# Patient Record
Sex: Female | Born: 1975 | Race: White | Hispanic: No | Marital: Married | Smoking: Never smoker
Health system: Southern US, Community
[De-identification: ages and names within clinical notes are randomized; demographics above are authoritative.]

## PROBLEM LIST (undated history)

## (undated) DIAGNOSIS — N920 Excessive and frequent menstruation with regular cycle: Secondary | ICD-10-CM

## (undated) DIAGNOSIS — F419 Anxiety disorder, unspecified: Secondary | ICD-10-CM

## (undated) DIAGNOSIS — N946 Dysmenorrhea, unspecified: Secondary | ICD-10-CM

## (undated) DIAGNOSIS — F909 Attention-deficit hyperactivity disorder, unspecified type: Secondary | ICD-10-CM

## (undated) DIAGNOSIS — F32A Depression, unspecified: Secondary | ICD-10-CM

## (undated) DIAGNOSIS — F329 Major depressive disorder, single episode, unspecified: Secondary | ICD-10-CM

## (undated) HISTORY — DX: Anxiety disorder, unspecified: F41.9

## (undated) HISTORY — PX: FINGER SURGERY: SHX640

## (undated) SURGERY — Surgical Case
Anesthesia: *Unknown

---

## 2001-09-13 ENCOUNTER — Other Ambulatory Visit: Admission: RE | Admit: 2001-09-13 | Discharge: 2001-09-13 | Payer: Self-pay | Admitting: *Deleted

## 2002-12-01 ENCOUNTER — Other Ambulatory Visit: Admission: RE | Admit: 2002-12-01 | Discharge: 2002-12-01 | Payer: Self-pay | Admitting: Internal Medicine

## 2003-11-27 ENCOUNTER — Other Ambulatory Visit: Admission: RE | Admit: 2003-11-27 | Discharge: 2003-11-27 | Payer: Self-pay | Admitting: Internal Medicine

## 2005-01-21 ENCOUNTER — Other Ambulatory Visit: Admission: RE | Admit: 2005-01-21 | Discharge: 2005-01-21 | Payer: Self-pay | Admitting: Internal Medicine

## 2006-01-22 ENCOUNTER — Other Ambulatory Visit: Admission: RE | Admit: 2006-01-22 | Discharge: 2006-01-22 | Payer: Self-pay | Admitting: Cardiology

## 2007-03-26 ENCOUNTER — Other Ambulatory Visit: Admission: RE | Admit: 2007-03-26 | Discharge: 2007-03-26 | Payer: Self-pay | Admitting: Internal Medicine

## 2009-12-15 HISTORY — PX: FINGER SURGERY: SHX640

## 2012-03-10 ENCOUNTER — Ambulatory Visit: Payer: 59

## 2012-03-10 ENCOUNTER — Encounter: Payer: Self-pay | Admitting: Family Medicine

## 2012-03-10 ENCOUNTER — Ambulatory Visit (INDEPENDENT_AMBULATORY_CARE_PROVIDER_SITE_OTHER): Payer: 59 | Admitting: Family Medicine

## 2012-03-10 VITALS — BP 103/71 | HR 74 | Temp 98.0°F | Resp 16 | Ht 66.0 in | Wt 157.8 lb

## 2012-03-10 DIAGNOSIS — Z9289 Personal history of other medical treatment: Secondary | ICD-10-CM

## 2012-03-10 DIAGNOSIS — Z0289 Encounter for other administrative examinations: Secondary | ICD-10-CM

## 2012-03-10 DIAGNOSIS — Z0000 Encounter for general adult medical examination without abnormal findings: Secondary | ICD-10-CM | POA: Insufficient documentation

## 2012-03-10 DIAGNOSIS — Z021 Encounter for pre-employment examination: Secondary | ICD-10-CM

## 2012-03-10 DIAGNOSIS — Z3040 Encounter for surveillance of contraceptives, unspecified: Secondary | ICD-10-CM | POA: Insufficient documentation

## 2012-03-10 DIAGNOSIS — Z228 Carrier of other infectious diseases: Secondary | ICD-10-CM

## 2012-03-10 NOTE — Progress Notes (Signed)
  Subjective:    Patient ID: Alexa Leon, female    DOB: August 26, 1976, 36 y.o.   MRN: [id number]  HPI This healthy 36 y.o. 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 She has no chronic health issues and takes OCPs without adverse effects.  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 She has no symptoms.   Review of Systems  Constitutional: Negative.   HENT: Negative.   Cardiovascular: Negative.   Gastrointestinal: Negative.   Genitourinary: Negative.   Musculoskeletal: Negative.   Neurological: Negative.   Psychiatric/Behavioral: Negative.        Objective:   Physical Exam  Vitals reviewed. Constitutional: She is oriented to person, place, and time. She appears well-developed and well-nourished. No distress.  HENT:  Head: Normocephalic and atraumatic.  Left Ear: External ear normal.  Nose: Nose normal.  Mouth/Throat: Oropharynx is clear and moist.  Eyes: EOM are normal. Pupils are equal, round, and reactive to light. No scleral icterus.  Neck: Normal range of motion. Neck supple. No thyromegaly present.  Cardiovascular: Normal rate, regular rhythm and normal heart sounds.  Exam reveals no gallop and no friction rub.   No murmur heard. Pulmonary/Chest: Effort normal and breath sounds normal. No respiratory distress.  Abdominal: Soft. Bowel sounds are normal. She exhibits no mass. There is no tenderness. There is no guarding.  Musculoskeletal: Normal range of motion. She exhibits no edema and no tenderness.  Lymphadenopathy:    She has no cervical adenopathy.  Neurological: She is alert and oriented to person, place, and time. She has normal reflexes. No cranial nerve deficit. Coordination normal.  Skin: Skin is  warm and dry.  Psychiatric: She has a normal mood and affect. Her behavior is normal. Judgment and thought content normal.    [facility] reading (PRIMARY) by  Dr. Audria Nine: Negative for active disease        Assessment & Plan:   1. Physical exam, pre-employment  No abnormalities identified  2. History of positive PPD  DG Chest 2 View : Negative   Form completed  (see scanned document)

## 2012-10-04 ENCOUNTER — Other Ambulatory Visit (HOSPITAL_COMMUNITY)
Admission: RE | Admit: 2012-10-04 | Discharge: 2012-10-04 | Disposition: A | Discharge status: Home / Self Care | Payer: BC Managed Care – PPO | Source: Ambulatory Visit | Attending: Family Medicine | Admitting: Family Medicine

## 2012-10-04 DIAGNOSIS — Z124 Encounter for screening for malignant neoplasm of cervix: Secondary | ICD-10-CM | POA: Insufficient documentation

## 2012-10-04 DIAGNOSIS — Z1151 Encounter for screening for human papillomavirus (HPV): Secondary | ICD-10-CM | POA: Insufficient documentation

## 2013-03-28 IMAGING — CR DG CHEST 2V
2 series · 2 of 2 positions shown · non-contrast
Comparison: None.

CLINICAL DATA: History of positive PPD test for tuberculosis 13
years previously.

CHEST - 2 VIEW

[PA]
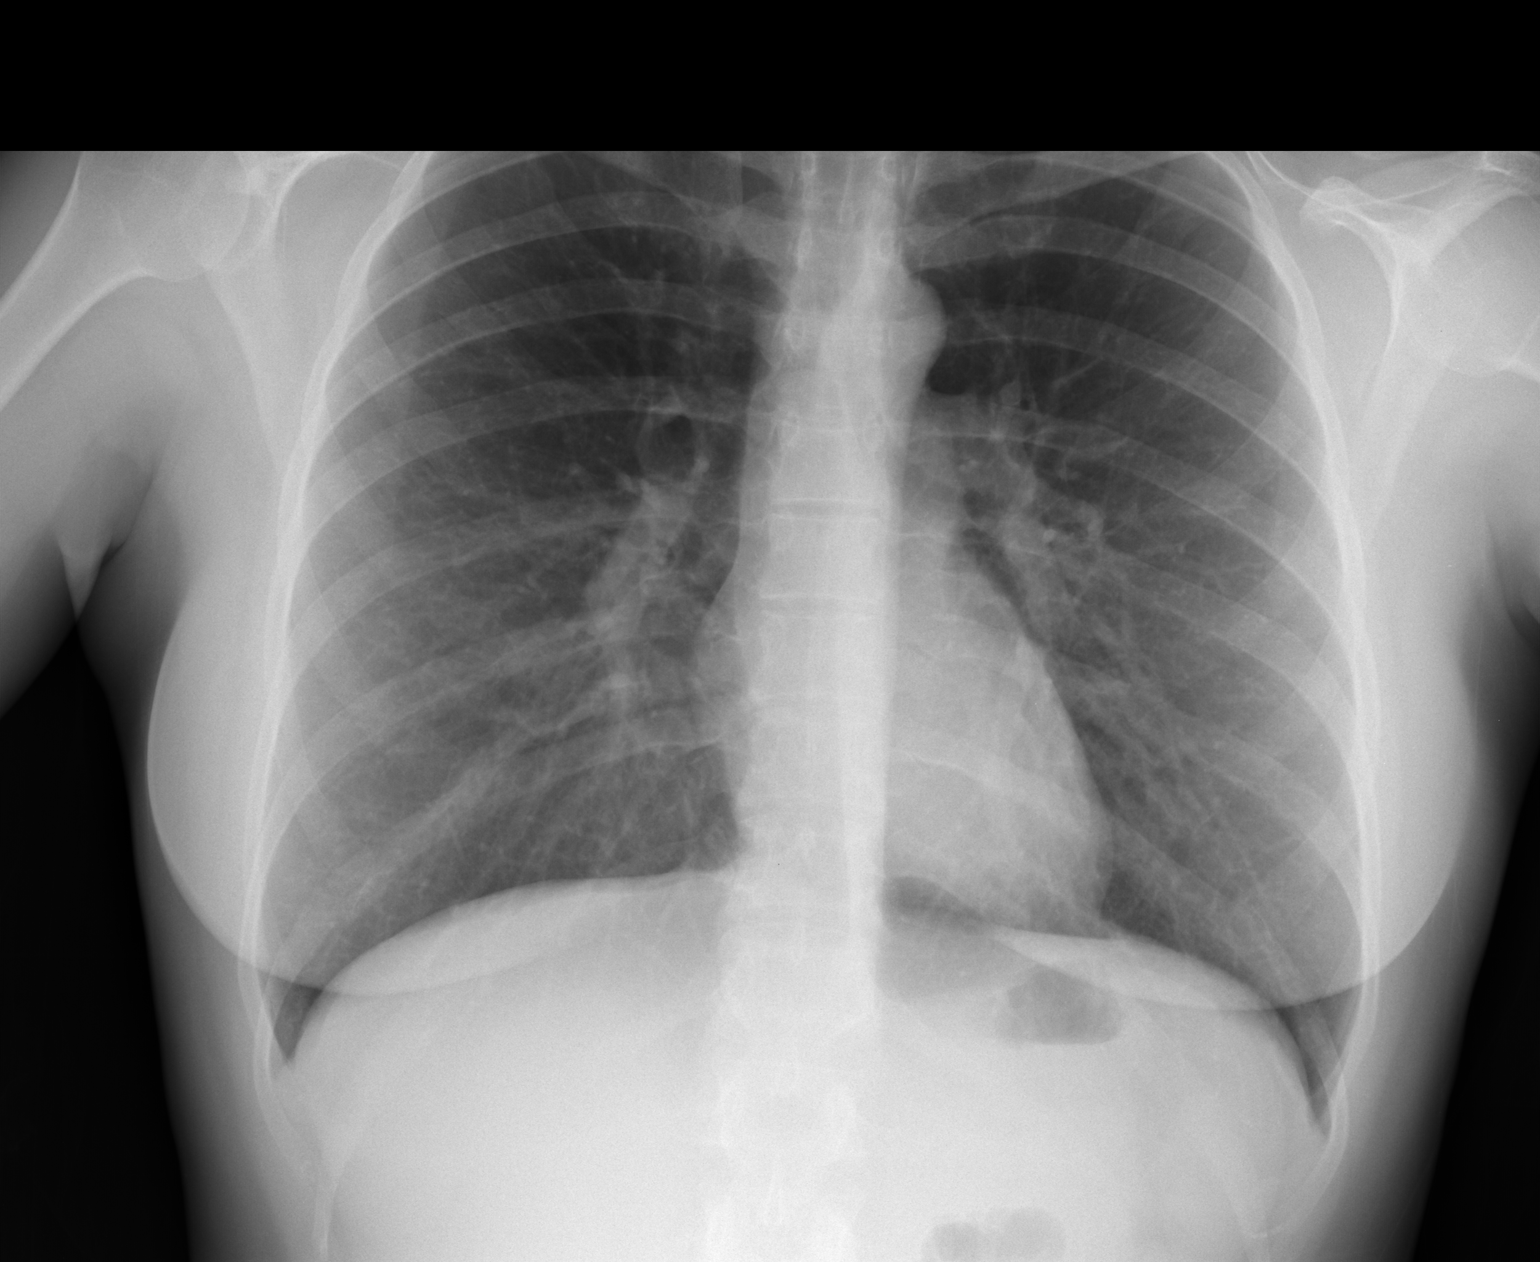

[lateral]
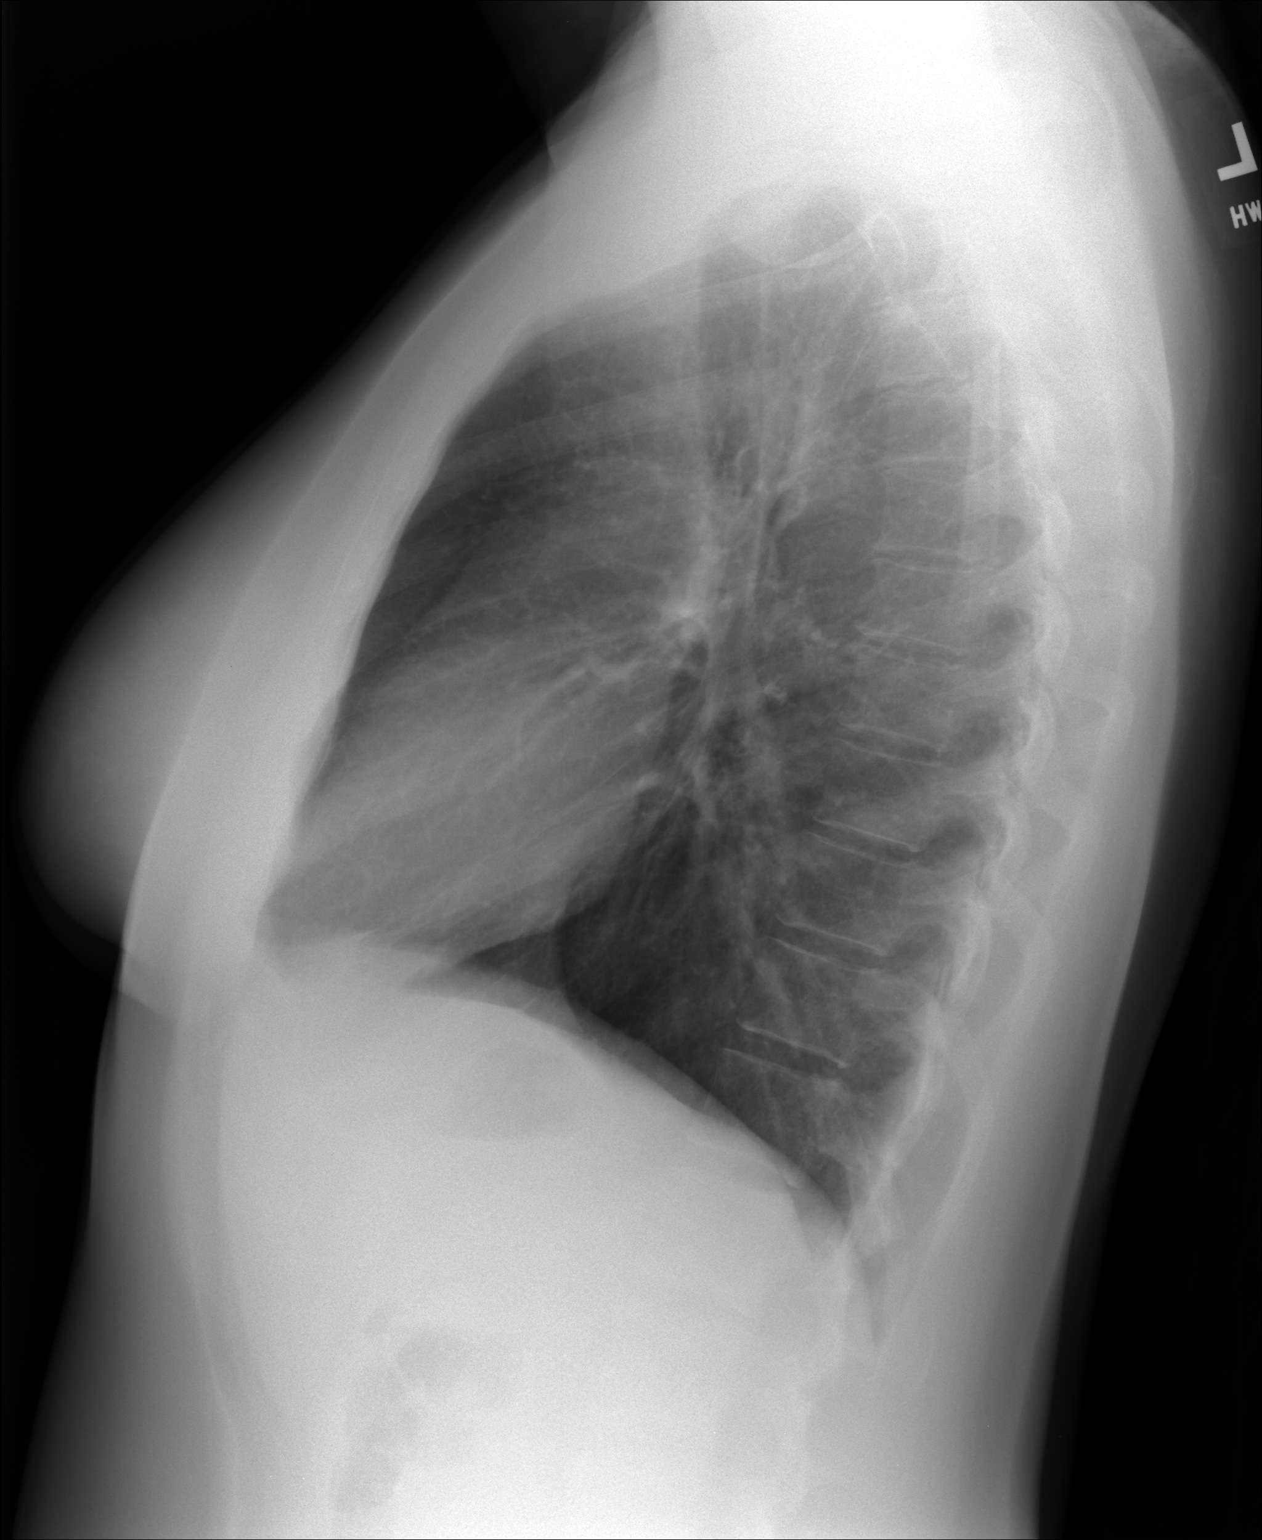

[2 of 2 positions shown; findings below may reference images not displayed]

FINDINGS: The cardiac silhouette is normal size and shape.  No
mediastinal or hilar abnormality is evident.  Lungs are free of
infiltrates or nodules. No pleural abnormality is evident. Bones
appear average for age.
IMPRESSION: No acute or active cardiopulmonary or pleural abnormality is
evident.  There is no evidence of tuberculosis.

## 2013-10-27 LAB — OB RESULTS CONSOLE HEPATITIS B SURFACE ANTIGEN: Hepatitis B Surface Ag: NEGATIVE

## 2013-10-27 LAB — OB RESULTS CONSOLE ANTIBODY SCREEN: ANTIBODY SCREEN: NEGATIVE

## 2013-10-27 LAB — OB RESULTS CONSOLE GC/CHLAMYDIA
Chlamydia: NEGATIVE
GC PROBE AMP, GENITAL: NEGATIVE

## 2013-10-27 LAB — OB RESULTS CONSOLE ABO/RH: RH TYPE: NEGATIVE

## 2013-10-27 LAB — OB RESULTS CONSOLE RUBELLA ANTIBODY, IGM: RUBELLA: IMMUNE

## 2013-10-27 LAB — OB RESULTS CONSOLE RPR: RPR: NONREACTIVE

## 2013-10-27 LAB — OB RESULTS CONSOLE HIV ANTIBODY (ROUTINE TESTING): HIV: NONREACTIVE

## 2013-12-15 NOTE — L&D Delivery Note (Signed)
Delivery Note At 7:48 AM a viable female was delivered via Vaginal, Spontaneous Delivery (Presentation: ;  ).  APGAR:9/9 , ; weight .   Placenta status: , .  Cord:  with the following complications: .  Cord pH: not sent  Anesthesia: Epidural  Episiotomy: None Lacerations: 2nd degree;Perineal Suture Repair: 3.0 vicryl rapide Est. Blood Loss (mL): 300  Mom to postpartum.  Baby to Nursery.  Jolette Lana M 05/26/2014, 8:04 AM

## 2014-05-12 LAB — OB RESULTS CONSOLE GBS: STREP GROUP B AG: POSITIVE

## 2014-05-15 ENCOUNTER — Inpatient Hospital Stay (HOSPITAL_COMMUNITY): Admission: AD | Admit: 2014-05-15 | Payer: Self-pay | Source: Ambulatory Visit | Admitting: Obstetrics & Gynecology

## 2014-05-25 ENCOUNTER — Inpatient Hospital Stay (HOSPITAL_COMMUNITY)
Admission: AD | Admit: 2014-05-25 | Discharge: 2014-05-27 | DRG: 775 | Disposition: A | Discharge status: Home / Self Care | Payer: BC Managed Care – PPO | Source: Ambulatory Visit | Attending: Obstetrics and Gynecology | Admitting: Obstetrics and Gynecology

## 2014-05-25 ENCOUNTER — Encounter (HOSPITAL_COMMUNITY): Payer: Self-pay | Admitting: *Deleted

## 2014-05-25 DIAGNOSIS — O99892 Other specified diseases and conditions complicating childbirth: Secondary | ICD-10-CM | POA: Diagnosis present

## 2014-05-25 DIAGNOSIS — O09529 Supervision of elderly multigravida, unspecified trimester: Secondary | ICD-10-CM | POA: Diagnosis present

## 2014-05-25 DIAGNOSIS — O9989 Other specified diseases and conditions complicating pregnancy, childbirth and the puerperium: Secondary | ICD-10-CM

## 2014-05-25 DIAGNOSIS — Z22330 Carrier of Group B streptococcus: Secondary | ICD-10-CM

## 2014-05-25 HISTORY — DX: Major depressive disorder, single episode, unspecified: F32.9

## 2014-05-25 HISTORY — DX: Depression, unspecified: F32.A

## 2014-05-25 LAB — POCT FERN TEST: POCT Fern Test: POSITIVE

## 2014-05-25 MED ORDER — OXYTOCIN BOLUS FROM INFUSION
500.0000 mL | INTRAVENOUS | Status: DC
  Administered 2014-05-26: 500 mL via INTRAVENOUS

## 2014-05-25 MED ORDER — LIDOCAINE HCL (PF) 1 % IJ SOLN
30.0000 mL | INTRAMUSCULAR | Status: DC | PRN
  Filled 2014-05-25: qty 30

## 2014-05-25 MED ORDER — LACTATED RINGERS IV SOLN: 500.0000 mL | INTRAVENOUS | Status: DC | PRN

## 2014-05-25 MED ORDER — CLINDAMYCIN PHOSPHATE 900 MG/50ML IV SOLN
900.0000 mg | Freq: Three times a day (TID) | INTRAVENOUS | Status: DC
  Administered 2014-05-26: 900 mg via INTRAVENOUS
  Filled 2014-05-25 (×4): qty 50

## 2014-05-25 MED ORDER — OXYCODONE-ACETAMINOPHEN 5-325 MG PO TABS
1.0000 | ORAL_TABLET | ORAL | Status: DC | PRN
Start: 2014-05-25 — End: 2014-05-26

## 2014-05-25 MED ORDER — OXYTOCIN 40 UNITS IN LACTATED RINGERS INFUSION - SIMPLE MED: 62.5000 mL/h | INTRAVENOUS | Status: DC

## 2014-05-25 MED ORDER — LACTATED RINGERS IV SOLN
INTRAVENOUS | Status: DC
  Administered 2014-05-26: via INTRAVENOUS

## 2014-05-25 MED ORDER — ACETAMINOPHEN 325 MG PO TABS: 650.0000 mg | ORAL_TABLET | ORAL | Status: DC | PRN

## 2014-05-25 MED ORDER — CITRIC ACID-SODIUM CITRATE 334-500 MG/5ML PO SOLN: 30.0000 mL | ORAL | Status: DC | PRN

## 2014-05-25 MED ORDER — ONDANSETRON HCL 4 MG/2ML IJ SOLN: 4.0000 mg | Freq: Four times a day (QID) | INTRAMUSCULAR | Status: DC | PRN

## 2014-05-25 MED ORDER — FLEET ENEMA 7-19 GM/118ML RE ENEM: 1.0000 | ENEMA | RECTAL | Status: DC | PRN

## 2014-05-25 MED ORDER — IBUPROFEN 600 MG PO TABS: 600.0000 mg | ORAL_TABLET | Freq: Four times a day (QID) | ORAL | Status: DC | PRN

## 2014-05-25 NOTE — MAU Note (Signed)
Pt reports leaking of fluid at 22:15. Pt reports contractions that are not painful at this time. +fm

## 2014-05-26 ENCOUNTER — Encounter (HOSPITAL_COMMUNITY): Payer: Self-pay | Admitting: *Deleted

## 2014-05-26 ENCOUNTER — Inpatient Hospital Stay (HOSPITAL_COMMUNITY): Payer: BC Managed Care – PPO | Admitting: Anesthesiology

## 2014-05-26 ENCOUNTER — Encounter (HOSPITAL_COMMUNITY): Payer: BC Managed Care – PPO | Admitting: Anesthesiology

## 2014-05-26 LAB — CBC
HCT: 36.1 % (ref 36.0–46.0)
Hemoglobin: 12.1 g/dL (ref 12.0–15.0)
MCH: 28.9 pg (ref 26.0–34.0)
MCHC: 33.5 g/dL (ref 30.0–36.0)
MCV: 86.4 fL (ref 78.0–100.0)
PLATELETS: 213 10*3/uL (ref 150–400)
RBC: 4.18 MIL/uL (ref 3.87–5.11)
RDW: 14.2 % (ref 11.5–15.5)
WBC: 10.9 10*3/uL — ABNORMAL HIGH (ref 4.0–10.5)

## 2014-05-26 LAB — RPR

## 2014-05-26 MED ORDER — PHENYLEPHRINE 40 MCG/ML (10ML) SYRINGE FOR IV PUSH (FOR BLOOD PRESSURE SUPPORT)
80.0000 ug | PREFILLED_SYRINGE | INTRAVENOUS | Status: DC | PRN
  Filled 2014-05-26: qty 2

## 2014-05-26 MED ORDER — BENZOCAINE-MENTHOL 20-0.5 % EX AERO
1.0000 "application " | INHALATION_SPRAY | CUTANEOUS | Status: DC | PRN
  Administered 2014-05-27: 1 via TOPICAL
  Filled 2014-05-26: qty 56

## 2014-05-26 MED ORDER — SENNOSIDES-DOCUSATE SODIUM 8.6-50 MG PO TABS
2.0000 | ORAL_TABLET | ORAL | Status: DC
  Administered 2014-05-26: 2 via ORAL
  Filled 2014-05-26: qty 2

## 2014-05-26 MED ORDER — ONDANSETRON HCL 4 MG/2ML IJ SOLN: 4.0000 mg | INTRAMUSCULAR | Status: DC | PRN

## 2014-05-26 MED ORDER — FENTANYL 2.5 MCG/ML BUPIVACAINE 1/10 % EPIDURAL INFUSION (WH - ANES)
14.0000 mL/h | INTRAMUSCULAR | Status: DC | PRN
  Administered 2014-05-26: 14 mL/h via EPIDURAL
  Filled 2014-05-26: qty 125

## 2014-05-26 MED ORDER — ONDANSETRON HCL 4 MG PO TABS: 4.0000 mg | ORAL_TABLET | ORAL | Status: DC | PRN

## 2014-05-26 MED ORDER — FLEET ENEMA 7-19 GM/118ML RE ENEM: 1.0000 | ENEMA | Freq: Every day | RECTAL | Status: DC | PRN

## 2014-05-26 MED ORDER — IBUPROFEN 800 MG PO TABS
800.0000 mg | ORAL_TABLET | Freq: Three times a day (TID) | ORAL | Status: DC | PRN
  Administered 2014-05-26 – 2014-05-27 (×3): 800 mg via ORAL
  Filled 2014-05-26 (×3): qty 1

## 2014-05-26 MED ORDER — EPHEDRINE 5 MG/ML INJ
10.0000 mg | INTRAVENOUS | Status: DC | PRN
  Filled 2014-05-26: qty 4
  Filled 2014-05-26: qty 2

## 2014-05-26 MED ORDER — TETANUS-DIPHTH-ACELL PERTUSSIS 5-2.5-18.5 LF-MCG/0.5 IM SUSP: 0.5000 mL | Freq: Once | INTRAMUSCULAR | Status: DC

## 2014-05-26 MED ORDER — SIMETHICONE 80 MG PO CHEW: 80.0000 mg | CHEWABLE_TABLET | ORAL | Status: DC | PRN

## 2014-05-26 MED ORDER — BISACODYL 10 MG RE SUPP: 10.0000 mg | Freq: Every day | RECTAL | Status: DC | PRN

## 2014-05-26 MED ORDER — PRENATAL MULTIVITAMIN CH
1.0000 | ORAL_TABLET | Freq: Every day | ORAL | Status: DC
  Administered 2014-05-26 – 2014-05-27 (×2): 1 via ORAL
  Filled 2014-05-26 (×2): qty 1

## 2014-05-26 MED ORDER — LANOLIN HYDROUS EX OINT: TOPICAL_OINTMENT | CUTANEOUS | Status: DC | PRN

## 2014-05-26 MED ORDER — ZOLPIDEM TARTRATE 5 MG PO TABS: 5.0000 mg | ORAL_TABLET | Freq: Every evening | ORAL | Status: DC | PRN

## 2014-05-26 MED ORDER — EPHEDRINE 5 MG/ML INJ
10.0000 mg | INTRAVENOUS | Status: DC | PRN
  Filled 2014-05-26: qty 2

## 2014-05-26 MED ORDER — PHENYLEPHRINE 40 MCG/ML (10ML) SYRINGE FOR IV PUSH (FOR BLOOD PRESSURE SUPPORT)
80.0000 ug | PREFILLED_SYRINGE | INTRAVENOUS | Status: DC | PRN
  Filled 2014-05-26: qty 2
  Filled 2014-05-26: qty 10

## 2014-05-26 MED ORDER — DIPHENHYDRAMINE HCL 25 MG PO CAPS: 25.0000 mg | ORAL_CAPSULE | Freq: Four times a day (QID) | ORAL | Status: DC | PRN

## 2014-05-26 MED ORDER — DIBUCAINE 1 % RE OINT: 1.0000 "application " | TOPICAL_OINTMENT | RECTAL | Status: DC | PRN

## 2014-05-26 MED ORDER — OXYCODONE-ACETAMINOPHEN 5-325 MG PO TABS
1.0000 | ORAL_TABLET | Freq: Four times a day (QID) | ORAL | Status: DC | PRN
  Administered 2014-05-26: 2 via ORAL
  Administered 2014-05-26 – 2014-05-27 (×2): 1 via ORAL
  Filled 2014-05-26 (×4): qty 1

## 2014-05-26 MED ORDER — TERBUTALINE SULFATE 1 MG/ML IJ SOLN: 0.2500 mg | Freq: Once | INTRAMUSCULAR | Status: DC | PRN

## 2014-05-26 MED ORDER — WITCH HAZEL-GLYCERIN EX PADS: 1.0000 "application " | MEDICATED_PAD | CUTANEOUS | Status: DC | PRN

## 2014-05-26 MED ORDER — LACTATED RINGERS IV SOLN
500.0000 mL | Freq: Once | INTRAVENOUS | Status: AC
  Administered 2014-05-26: 500 mL via INTRAVENOUS

## 2014-05-26 MED ORDER — DIPHENHYDRAMINE HCL 50 MG/ML IJ SOLN: 12.5000 mg | INTRAMUSCULAR | Status: DC | PRN

## 2014-05-26 MED ORDER — OXYTOCIN 40 UNITS IN LACTATED RINGERS INFUSION - SIMPLE MED
1.0000 m[IU]/min | INTRAVENOUS | Status: DC
  Filled 2014-05-26: qty 1000

## 2014-05-26 MED ORDER — LIDOCAINE HCL (PF) 1 % IJ SOLN
INTRAMUSCULAR | Status: DC | PRN
  Administered 2014-05-26 (×2): 5 mL

## 2014-05-26 MED ORDER — MEASLES, MUMPS & RUBELLA VAC ~~LOC~~ INJ
0.5000 mL | INJECTION | Freq: Once | SUBCUTANEOUS | Status: DC
  Filled 2014-05-26: qty 0.5

## 2014-05-26 NOTE — Lactation Note (Signed)
This note was copied from the chart of Alexa Linda HedgesBonnie Brunkhorst. Lactation Consultation Note  Patient Name: Alexa Linda HedgesBonnie Brinkley UXLKG'MToday's Date: 05/26/2014 Reason for consult: Initial assessment  Mom reports infant has been sleepy; breastfed this morning but has attempted this afternoon.  After being assessed by RN infant began showing feeding cues.  LC offered assistance with latching and mom agreed.  Infant latched in cross cradle hold, asymetrical latching technique after few attempts to grasp nipple and sucked with rhythmical sucking; minimal stimulation needed to keep him sucking.  Few swallows heard with compressions.  Taught hand expression with return demonstration and observation of colostrum pouring from breast.  Infant is 9.5 hrs old and has breastfed x3 (10-15 min) + 2 attempts; voids-1, stools-0.  Infant spit-up moderate amount of mucus clear in color; mom reports "fast" vaginal delivery.  Lots basic teaching reviewed with mom.  Mom experienced with first child 5 months and then was given an Abx and was advised to "pump and dump"; mom stated her milk dried up.  Encouraged mom to call LC if put on Abx for information regarding categorization of lactation risks according to Dr. Martin MajesticHale's classifications.  Reviewed supply/ demand, size of infant's stomach, and feeding cues.  Encouraged to feed with cues.  Lactation brochure given and informed of outpatient services, community support, and hospital support group.  Encouraged to call for assistance as needed.     Maternal Data Formula Feeding for Exclusion: No Infant to breast within first hour of birth: Yes Has patient been taught Hand Expression?: Yes (with return demonstration and lots colostrum observed) Does the patient have breastfeeding experience prior to this delivery?: Yes 5 months  Feeding Feeding Type: Breast Fed  LATCH Score/Interventions Latch: Grasps breast easily, tongue down, lips flanged, rhythmical sucking.  Audible Swallowing: A  few with stimulation Intervention(s): Skin to skin;Hand expression  Type of Nipple: Everted at rest and after stimulation  Comfort (Breast/Nipple): Soft / non-tender     Hold (Positioning): Assistance needed to correctly position infant at breast and maintain latch. Intervention(s): Breastfeeding basics reviewed;Support Pillows;Skin to skin  LATCH Score: 8  Lactation Tools Discussed/Used WIC Program: No   Consult Status Consult Status: Follow-up Date: 05/27/14 Follow-up type: In-patient    Lendon KaVann, Renezmae Canlas Walker 05/26/2014, 5:08 PM

## 2014-05-26 NOTE — H&P (Signed)
Alexa HedgesBonnie Leon is a 38 y.o. female presenting with SROM at 38.2.  Positive GBS Maternal Medical History:  Reason for admission: Rupture of membranes.   Contractions: Onset was 1-2 hours ago.   Frequency: irregular.   Perceived severity is moderate.    Fetal activity: Perceived fetal activity is normal.    Prenatal complications: no prenatal complications Prenatal Complications - Diabetes: none.    OB History   Grav Para Term Preterm Abortions TAB SAB Ect Mult Living   2 1 1             Past Medical History  Diagnosis Date  . Depression     PP   Past Surgical History  Procedure Laterality Date  . Finger surgery Right     2012   Family History: family history is not on file. Social History:  reports that she has never smoked. She does not have any smokeless tobacco history on file. She reports that she does not drink alcohol or use illicit drugs.   Prenatal Transfer Tool  Maternal Diabetes: No Genetic Screening: Normal Maternal Ultrasounds/Referrals: Normal Fetal Ultrasounds or other Referrals:  None Maternal Substance Abuse:  No Significant Maternal Medications:  None Significant Maternal Lab Results:  Lab values include: Group B Strep positive Other Comments:  None  ROS  Dilation: 4 Effacement (%): 50 Station: -3 Exam by:: E.Clapper, RN Blood pressure 100/59, pulse 92, temperature 98.1 F (36.7 C), temperature source Oral, resp. rate 16, height 5\' 6"  (1.676 m), weight 82.555 kg (182 lb), SpO2 98.00%. Maternal Exam:  Uterine Assessment: Contraction strength is moderate.  Contraction frequency is regular.   Abdomen: Fundal height is c/w dates.   Estimated fetal weight is 7.   Fetal presentation: vertex  Introitus: Amniotic fluid character: clear.  Cervix: 4 cm  And complete  Fetal Exam Fetal State Assessment: Category I - tracings are normal.     Physical Exam  Prenatal labs: ABO, Rh: B/Negative/-- (11/13 0000) Antibody: Negative (11/13  0000) Rubella: Immune (11/13 0000) RPR: Nonreactive (11/13 0000)  HBsAg: Negative (11/13 0000)  HIV: Non-reactive (11/13 0000)  GBS: Positive (05/29 0000)   Assessment/Plan: IUP at 38+ with  SROM Positve GBS Antibiotics routine labor and delivery   Alexa Leon S 05/26/2014, 4:02 AM

## 2014-05-26 NOTE — Progress Notes (Signed)
Patient ID: Linda HedgesBonnie Pogue, female   DOB: Apr 18, 1976, 38 y.o.   MRN: [id number] No change in cervix will begin pitocin risks discussed

## 2014-05-26 NOTE — Anesthesia Procedure Notes (Signed)
Epidural Patient location during procedure: OB Start time: 05/26/2014 1:13 AM  Staffing Anesthesiologist: Brayton CavesJACKSON, Williette Loewe Performed by: anesthesiologist   Preanesthetic Checklist Completed: patient identified, site marked, surgical consent, pre-op evaluation, timeout performed, IV checked, risks and benefits discussed and monitors and equipment checked  Epidural Patient position: sitting Prep: site prepped and draped and DuraPrep Patient monitoring: continuous pulse ox and blood pressure Approach: midline Location: L3-L4 Injection technique: LOR air  Needle:  Needle type: Tuohy  Needle gauge: 17 G Needle length: 9 cm and 9 Needle insertion depth: 5 cm cm Catheter type: closed end flexible Catheter size: 19 Gauge Catheter at skin depth: 10 cm Test dose: negative  Assessment Events: blood not aspirated, injection not painful, no injection resistance, negative IV test and no paresthesia  Additional Notes Patient identified.  Risk benefits discussed including failed block, incomplete pain control, headache, nerve damage, paralysis, blood pressure changes, nausea, vomiting, reactions to medication both toxic or allergic, and postpartum back pain.  Patient expressed understanding and wished to proceed.  All questions were answered.  Sterile technique used throughout procedure and epidural site dressed with sterile barrier dressing. No paresthesia or other complications noted.The patient did not experience any signs of intravascular injection such as tinnitus or metallic taste in mouth nor signs of intrathecal spread such as rapid motor block. Please see nursing notes for vital signs.

## 2014-05-26 NOTE — Anesthesia Preprocedure Evaluation (Signed)

## 2014-05-26 NOTE — Anesthesia Postprocedure Evaluation (Signed)
Anesthesia Post Note  Patient: Alexa HedgesBonnie Leon  Procedure(s) Performed: * No procedures listed *  Anesthesia type: Epidural  Patient location: Mother/Baby  Post pain: Pain level controlled  Post assessment: Post-op Vital signs reviewed  Last Vitals:  Filed Vitals:   05/26/14 1630  BP: 93/63  Pulse: 87  Temp: 36.8 C  Resp: 20    Post vital signs: Reviewed  Level of consciousness:alert  Complications: No apparent anesthesia complications

## 2014-05-27 ENCOUNTER — Ambulatory Visit: Payer: Self-pay

## 2014-05-27 LAB — CBC
HCT: 32.7 % — ABNORMAL LOW (ref 36.0–46.0)
Hemoglobin: 10.8 g/dL — ABNORMAL LOW (ref 12.0–15.0)
MCH: 29 pg (ref 26.0–34.0)
MCHC: 33 g/dL (ref 30.0–36.0)
MCV: 87.7 fL (ref 78.0–100.0)
Platelets: 173 10*3/uL (ref 150–400)
RBC: 3.73 MIL/uL — ABNORMAL LOW (ref 3.87–5.11)
RDW: 14.5 % (ref 11.5–15.5)
WBC: 12.2 10*3/uL — ABNORMAL HIGH (ref 4.0–10.5)

## 2014-05-27 MED ORDER — IBUPROFEN 800 MG PO TABS: 800.0000 mg | ORAL_TABLET | Freq: Three times a day (TID) | ORAL | Status: DC | PRN

## 2014-05-27 MED ORDER — OXYCODONE-ACETAMINOPHEN 5-325 MG PO TABS: 1.0000 | ORAL_TABLET | Freq: Four times a day (QID) | ORAL | Status: DC | PRN

## 2014-05-27 NOTE — Discharge Summary (Signed)
Obstetric Discharge Summary Reason for Admission: onset of labor Prenatal Procedures: none Intrapartum Procedures: spontaneous vaginal delivery Postpartum Procedures: none Complications-Operative and Postpartum: none Hemoglobin  Date Value Ref Range Status  05/27/2014 10.8* 12.0 - 15.0 g/dL Final     HCT  Date Value Ref Range Status  05/27/2014 32.7* 36.0 - 46.0 % Final    Physical Exam:  General: alert Lochia: appropriate Uterine Fundus: firm Incision: healing well DVT Evaluation: No evidence of DVT seen on physical exam.  Discharge Diagnoses: Term Pregnancy-delivered  Discharge Information: Date: 05/27/2014 Activity: pelvic rest Diet: routine Medications: PNV, Ibuprofen and Percocet Condition: stable Instructions: refer to practice specific booklet Discharge to: home Follow-up Information   [facility] Schedule an appointment as soon as possible for a visit in 6 weeks.   Contact information:   [address] [place] [id number] [phone]      Newborn Data: Live born female  Birth Weight: 7 lb 13.4 oz (3555 g) APGAR: 8, 9  Home with mother.  Meriel PicaHOLLAND,Emit Kuenzel M 05/27/2014, 9:44 AM

## 2014-05-27 NOTE — Progress Notes (Signed)
Clinical Social Work Department PSYCHOSOCIAL ASSESSMENT - MATERNAL/CHILD 05/27/2014  Patient:  Alexa Leon,Alexa Leon  Account Number:  [id number]  Admit Date:  05/25/2014  Childs Name:   Alexa Leon    Clinical Social Worker:  Anie Juniel, LCSW   Date/Time:  05/27/2014 11:30 AM  Date Referred:  05/26/2014   Referral source  Central Nursery     Referred reason  Hx of PP Depression   Other referral source:    I:  FAMILY / HOME ENVIRONMENT Child's legal guardian:  PARENT  Guardian - Name Guardian - Age Guardian - Address  Alexa Leon,Alexa Leon 38 [address]  [place], [place] [address]   Other household support members/support persons Other support:    II  PSYCHOSOCIAL DATA Information Source:    Financial and Community Resources Employment:   Parents employed   Financial resources:  Private Insurance If Medicaid - County:    School / Grade:   Maternity Care Coordinator / Child Services Coordination / Early Interventions:  Cultural issues impacting care:    III  STRENGTHS Strengths  Supportive family/friends  Home prepared for Child (including basic supplies)  Adequate Resources   Strength comment:    IV  RISK FACTORS AND CURRENT PROBLEMS Current Problem:       V  SOCIAL WORK ASSESSMENT Acknowledged order for Social Work consult to assess mother's history of PP Depression.  Parents are married. Spouse was present and very attentive to mother and newborn.  Mother state that shortly after delivery of her 36 year old, she noticed an acute change in her mood. However, it was not until about 2 weeks later that she acknowledged it was an issue and sought treatment. Informed that her experience with this pregnancy has been very different.  She denies having any of the mood changes she experience with the previous child.  Parents report extensive family support.  Mother is aware of how to access treatment if needed again.    Parents informed of social work availability.      VI  SOCIAL WORK PLAN Social Work Plan  No Further Intervention Required / No Barriers to Discharge    

## 2014-05-27 NOTE — Lactation Note (Signed)
This note was copied from the chart of Boy Linda HedgesBonnie Donahue. Lactation Consultation Note  Patient Name: Boy Linda HedgesBonnie Stiehl WGNFA'OToday's Date: 05/27/2014 Reason for consult: Follow-up assessment  Infant has breastfed x9 (20-40 min) + 4 attempts + bottled formula x1 (15 ml); voids-5 in past 24 hrs/ 6 life; stools - 4 in 24 hrs and life.  Infant was circumcised today.   Mom latched infant independently to right side in cross-cradle hold; LS-9; few swallows heard.  Infant sucked with consistent deep rhythm.  RN reported mom had a nipple shield in room.  Mom brought a #24 nipple shield from home because she used one with the first child.  #24 too large so RN gave mom a #20 [facility]ipple shield [facility]  Mom stated that baby was acting hungry last night and having difficulty latching so she used the shield but stated that she did not think the infant liked the shield.  Infant latched at current feeding without shield.  Encouragement given to parents and educated on cluster feeding, growth spurts, and noted mom's erect nipples for easy latching.  Reviewed with mom the asymetrical latching technique (which she used with current latching independently) and how that technique helps achieve depth and compressions during feeding to help maximize milk intake.  Also encouraged mom to do a chin tug to help flange infant's bottom lip and assist with wider latch during the times she feels a "pinch."  Mom independently practiced techniques and stated the latch felt good.  Reviewed basics; encouraged exclusive breastfeeding and importance for attaining milk supply.  Informed again of hospital support group and outpatient services.  Encouraged to call for questions as needed.     Maternal Data    Feeding Length of feed: 0 min (Infant to sleepy.)  LATCH Score/Interventions Latch: Grasps breast easily, tongue down, lips flanged, rhythmical sucking.  Audible Swallowing: A few with stimulation  Type of Nipple: Everted at  rest and after stimulation  Comfort (Breast/Nipple): Soft / non-tender     Hold (Positioning): No assistance needed to correctly position infant at breast. Intervention(s): Breastfeeding basics reviewed  LATCH Score: 9  Lactation Tools Discussed/Used     Consult Status Consult Status: Complete    Lendon KaVann, Tramain Gershman Walker 05/27/2014, 2:22 PM

## 2014-10-16 ENCOUNTER — Encounter (HOSPITAL_COMMUNITY): Payer: Self-pay | Admitting: *Deleted

## 2015-05-15 ENCOUNTER — Telehealth: Payer: Self-pay | Admitting: Family Medicine

## 2015-05-15 NOTE — Telephone Encounter (Signed)
Appointment given for 6/15 @ 9:25 with Montey HoraBill Webster, PAC.

## 2015-05-23 ENCOUNTER — Ambulatory Visit: Payer: Self-pay | Admitting: Physician Assistant

## 2015-05-30 ENCOUNTER — Encounter: Payer: Self-pay | Admitting: Physician Assistant

## 2015-05-30 ENCOUNTER — Ambulatory Visit (INDEPENDENT_AMBULATORY_CARE_PROVIDER_SITE_OTHER): Payer: BC Managed Care – PPO

## 2015-05-30 ENCOUNTER — Ambulatory Visit (INDEPENDENT_AMBULATORY_CARE_PROVIDER_SITE_OTHER): Payer: BC Managed Care – PPO | Admitting: Physician Assistant

## 2015-05-30 ENCOUNTER — Encounter (INDEPENDENT_AMBULATORY_CARE_PROVIDER_SITE_OTHER): Payer: Self-pay

## 2015-05-30 VITALS — BP 106/74 | HR 80 | Temp 97.9°F | Ht 66.0 in | Wt 158.0 lb

## 2015-05-30 DIAGNOSIS — Z0000 Encounter for general adult medical examination without abnormal findings: Secondary | ICD-10-CM | POA: Diagnosis not present

## 2015-05-30 DIAGNOSIS — R7611 Nonspecific reaction to tuberculin skin test without active tuberculosis: Secondary | ICD-10-CM

## 2015-05-30 NOTE — Patient Instructions (Signed)

## 2015-05-30 NOTE — Progress Notes (Signed)
Subjective:     Patient ID: Alexa Leon, female   DOB: December 28, 1975, 39 y.o.   MRN: [id number]  HPI Pt new to practice She is needing PE to work in daycare She has a hx of + PPD but negative CXR   Review of Systems  Constitutional: Negative.   HENT: Negative.   Eyes: Negative.   Respiratory: Negative.   Cardiovascular: Negative.   Gastrointestinal: Negative.   Endocrine: Negative.   Genitourinary: Negative.   Musculoskeletal: Negative.   Skin: Negative.   Allergic/Immunologic: Negative.   Neurological: Negative.   Hematological: Negative.   Psychiatric/Behavioral: Negative.        Objective:   Physical Exam  Constitutional: She is oriented to person, place, and time. She appears well-developed and well-nourished.  HENT:  Head: Normocephalic and atraumatic.  Right Ear: External ear normal.  Left Ear: External ear normal.  Mouth/Throat: Oropharynx is clear and moist. No oropharyngeal exudate.  Eyes: EOM are normal. Pupils are equal, round, and reactive to light.  Neck: Normal range of motion. Neck supple. No JVD present. No thyromegaly present.  Cardiovascular: Normal rate, regular rhythm, normal heart sounds and intact distal pulses.   Pulmonary/Chest: Effort normal and breath sounds normal.  Abdominal: Soft. Bowel sounds are normal. She exhibits no distension and no mass. There is no tenderness. There is no rebound and no guarding.  Musculoskeletal: Normal range of motion.  Lymphadenopathy:    She has no cervical adenopathy.  Neurological: She is alert and oriented to person, place, and time. She has normal reflexes.  Skin: Skin is warm and dry.  Psychiatric: She has a normal mood and affect. Her behavior is normal. Judgment and thought content normal.  Nursing note and vitals reviewed. CXR- no abnl seen     Assessment:     Physical exam    Plan:     Form filled out today Pt has regular f/u with her OB/Gyn MD Healthy lifestyle reviewed F/U prn

## 2016-06-16 IMAGING — CR DG CHEST 2V
2 series · 2 of 2 positions shown · non-contrast
Comparison: None.

CLINICAL DATA: Previous positive TB skin test, physical exam

EXAM:
CHEST  2 VIEW

[view not recorded (1 of 2)]
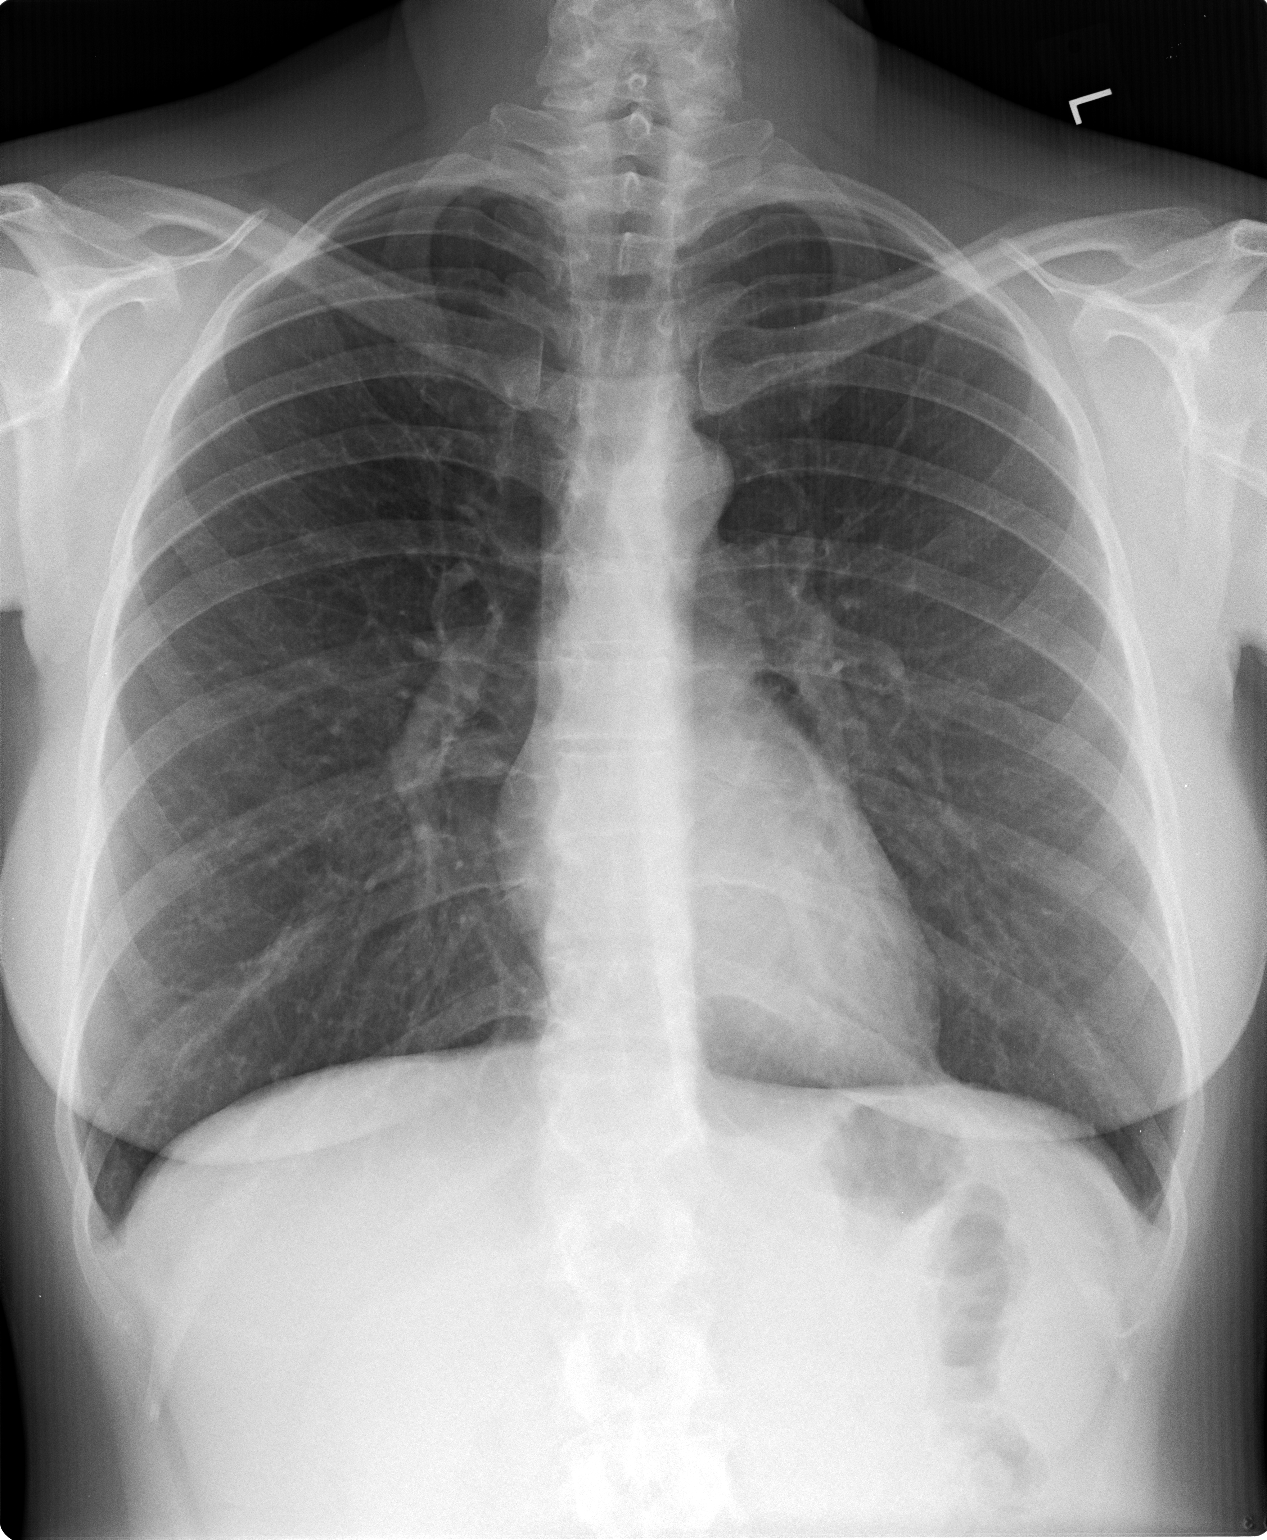

[view not recorded (2 of 2)]
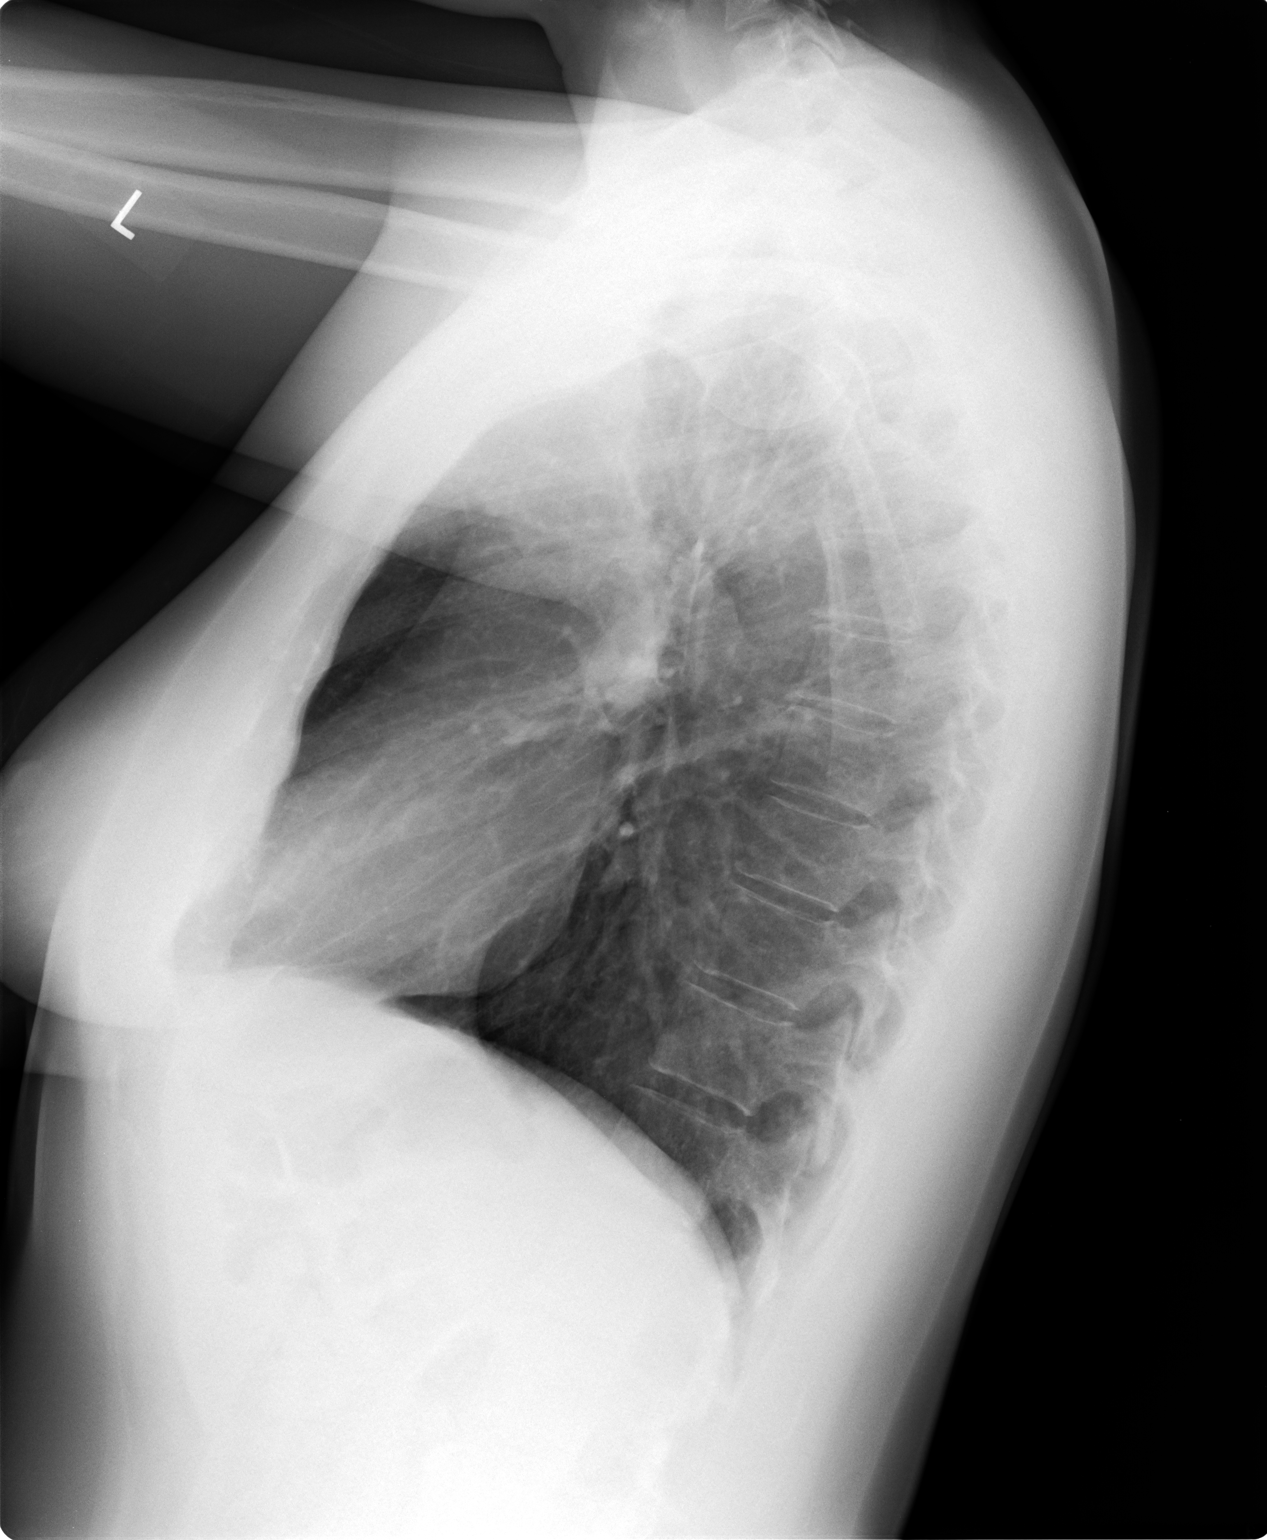

[2 of 2 positions shown; findings below may reference images not displayed]

FINDINGS: Cardiomediastinal silhouette is unremarkable. No acute infiltrate or
pleural effusion. No pulmonary edema. Bony thorax is unremarkable.
IMPRESSION: No active cardiopulmonary disease.

## 2016-08-07 ENCOUNTER — Ambulatory Visit (INDEPENDENT_AMBULATORY_CARE_PROVIDER_SITE_OTHER): Payer: BC Managed Care – PPO | Admitting: Family Medicine

## 2016-08-07 ENCOUNTER — Encounter: Payer: Self-pay | Admitting: Family Medicine

## 2016-08-07 DIAGNOSIS — F419 Anxiety disorder, unspecified: Principal | ICD-10-CM

## 2016-08-07 DIAGNOSIS — F329 Major depressive disorder, single episode, unspecified: Secondary | ICD-10-CM

## 2016-08-07 DIAGNOSIS — F418 Other specified anxiety disorders: Secondary | ICD-10-CM | POA: Diagnosis not present

## 2016-08-07 DIAGNOSIS — F339 Major depressive disorder, recurrent, unspecified: Secondary | ICD-10-CM | POA: Insufficient documentation

## 2016-08-07 MED ORDER — HYDROXYZINE HCL 25 MG PO TABS: 25.0000 mg | ORAL_TABLET | Freq: Three times a day (TID) | ORAL | 1 refills | Status: DC | PRN

## 2016-08-07 MED ORDER — ESCITALOPRAM OXALATE 10 MG PO TABS: 10.0000 mg | ORAL_TABLET | Freq: Every day | ORAL | 1 refills | Status: DC

## 2016-08-07 NOTE — Progress Notes (Signed)
BP 114/83   Pulse 85   Temp 97.6 F (36.4 C) (Oral)   Ht 5\' 6"  (1.676 m)   Wt 158 lb 12.8 oz (72 kg)   LMP 07/17/2016 (Approximate)   BMI 25.63 kg/m    Subjective:    Patient ID: Alexa Leon, female    DOB: [birth year], 40 y.o.   MRN: [id number]  HPI: Alexa Leon is a 40 y.o. female presenting on 08/07/2016 for Anxiety (has been going on for years but she has hesitated to talk with anyone about it.)   HPI Anxiety Patient comes in today with complaints of anxiety and panic attacks and some depression. She has had this in her life previously and was on treatment for Zoloft and Wellbutrin many years ago and then did better for a while. More recently over the past few years she has had it building up to where she gets anxiety so much that sometimes in social situations like stores or work parties she has had to leave because she gets so anxious and panicky and starts breathing quickly and just has to walk away from the situation. She says she's also had a lot of stressors at home and that she is always worrying about getting dinner ready on time and doing with a 57-year-old and all of the stressors that come along with that. She has just finally reached a point where she wants to come in. She says she does not have these anxiety attacks every day but does have them frequently.  Relevant past medical, surgical, family and social history reviewed and updated as indicated. Interim medical history since our last visit reviewed. Allergies and medications reviewed and updated.  Review of Systems  Constitutional: Negative for chills and fever.  HENT: Negative for congestion, ear discharge and ear pain.   Eyes: Negative for redness and visual disturbance.  Respiratory: Negative for chest tightness and shortness of breath.   Cardiovascular: Negative for chest pain and leg swelling.  Genitourinary: Negative for difficulty urinating and dysuria.  Musculoskeletal: Negative for back pain and  gait problem.  Skin: Negative for rash.  Neurological: Negative for light-headedness and headaches.  Psychiatric/Behavioral: Positive for decreased concentration and dysphoric mood. Negative for agitation, behavioral problems, self-injury, sleep disturbance and suicidal ideas. The patient is nervous/anxious.   All other systems reviewed and are negative.   Per HPI unless specifically indicated above     Medication List       Accurate as of 08/07/16  8:44 AM. Always use your most recent med list.          escitalopram 10 MG tablet Commonly known as:  LEXAPRO Take 1 tablet (10 mg total) by mouth daily.   hydrOXYzine 25 MG tablet Commonly known as:  ATARAX/VISTARIL Take 1 tablet (25 mg total) by mouth 3 (three) times daily as needed.   ibuprofen 800 MG tablet Commonly known as:  ADVIL,MOTRIN Take 1 tablet (800 mg total) by mouth every 8 (eight) hours as needed for moderate pain.          Objective:    BP 114/83   Pulse 85   Temp 97.6 F (36.4 C) (Oral)   Ht 5\' 6"  (1.676 m)   Wt 158 lb 12.8 oz (72 kg)   LMP 07/17/2016 (Approximate)   BMI 25.63 kg/m   Wt Readings from Last 3 Encounters:  08/07/16 158 lb 12.8 oz (72 kg)  05/30/15 158 lb (71.7 kg)  05/26/14 182 lb (82.6 kg)    Physical  Exam  Constitutional: She is oriented to person, place, and time. She appears well-developed and well-nourished. No distress.  Eyes: Conjunctivae and EOM are normal. Pupils are equal, round, and reactive to light.  Cardiovascular: Normal rate, regular rhythm, normal heart sounds and intact distal pulses.   No murmur heard. Pulmonary/Chest: Effort normal and breath sounds normal. No respiratory distress. She has no wheezes.  Musculoskeletal: Normal range of motion. She exhibits no edema or tenderness.  Neurological: She is alert and oriented to person, place, and time. Coordination normal.  Skin: Skin is warm and dry. No rash noted. She is not diaphoretic.  Psychiatric: Her behavior  is normal. Thought content normal. Her mood appears anxious. She exhibits a depressed mood. She expresses no suicidal ideation. She expresses no suicidal plans.  Nursing note and vitals reviewed.   [facility]/07/2016 05/30/2015  Decreased Interest 1 0  Down, Depressed, Hopeless 1 1  PHQ - 2 Score 2 1  Altered sleeping 0 -  Tired, decreased energy 1 -  Change in appetite 0 -  Feeling bad or failure about yourself  1 -  Trouble concentrating 1 -  Moving slowly or fidgety/restless 0 -  Suicidal thoughts 0 -  PHQ-9 Score 5 -  Difficult doing work/chores Somewhat difficult -       Assessment & Plan:   Problem List Items Addressed This Visit      Other   Anxiety and depression   Relevant Medications   escitalopram (LEXAPRO) 10 MG tablet   hydrOXYzine (ATARAX/VISTARIL) 25 MG tablet   Other Relevant Orders   TSH   CBC with Differential/Platelet    Other Visit Diagnoses   None.      Follow up plan: Return in about 4 weeks (around 09/04/2016), or if symptoms worsen or fail to improve, for Recheck anxiety.  Counseling provided for all of the vaccine components Orders Placed This Encounter  Procedures  . TSH  . CBC with Differential/Platelet    Arville CareJoshua Dettinger, MD [facility] 08/07/2016, 8:44 AM

## 2016-08-08 LAB — CBC WITH DIFFERENTIAL/PLATELET
BASOS: 1 %
Basophils Absolute: 0.1 10*3/uL (ref 0.0–0.2)
EOS (ABSOLUTE): 0.3 10*3/uL (ref 0.0–0.4)
EOS: 3 %
HEMATOCRIT: 42 % (ref 34.0–46.6)
HEMOGLOBIN: 13.9 g/dL (ref 11.1–15.9)
IMMATURE GRANS (ABS): 0 10*3/uL (ref 0.0–0.1)
IMMATURE GRANULOCYTES: 0 %
LYMPHS: 49 %
Lymphocytes Absolute: 3.8 10*3/uL — ABNORMAL HIGH (ref 0.7–3.1)
MCH: 27.9 pg (ref 26.6–33.0)
MCHC: 33.1 g/dL (ref 31.5–35.7)
MCV: 84 fL (ref 79–97)
MONOS ABS: 0.5 10*3/uL (ref 0.1–0.9)
Monocytes: 7 %
Neutrophils Absolute: 3.1 10*3/uL (ref 1.4–7.0)
Neutrophils: 40 %
Platelets: 236 10*3/uL (ref 150–379)
RBC: 4.99 x10E6/uL (ref 3.77–5.28)
RDW: 14.4 % (ref 12.3–15.4)
WBC: 7.7 10*3/uL (ref 3.4–10.8)

## 2016-08-08 LAB — TSH: TSH: 1.98 u[IU]/mL (ref 0.450–4.500)

## 2016-09-04 ENCOUNTER — Encounter: Payer: Self-pay | Admitting: Family Medicine

## 2016-09-04 ENCOUNTER — Ambulatory Visit (INDEPENDENT_AMBULATORY_CARE_PROVIDER_SITE_OTHER): Payer: BC Managed Care – PPO | Admitting: Family Medicine

## 2016-09-04 VITALS — BP 103/69 | HR 80 | Temp 98.6°F | Ht 66.0 in | Wt 158.4 lb

## 2016-09-04 DIAGNOSIS — F32A Depression, unspecified: Secondary | ICD-10-CM

## 2016-09-04 DIAGNOSIS — F418 Other specified anxiety disorders: Secondary | ICD-10-CM | POA: Diagnosis not present

## 2016-09-04 DIAGNOSIS — F419 Anxiety disorder, unspecified: Principal | ICD-10-CM

## 2016-09-04 DIAGNOSIS — F329 Major depressive disorder, single episode, unspecified: Secondary | ICD-10-CM

## 2016-09-04 MED ORDER — ESCITALOPRAM OXALATE 20 MG PO TABS: 20.0000 mg | ORAL_TABLET | Freq: Every day | ORAL | 1 refills | Status: DC

## 2016-09-04 NOTE — Progress Notes (Signed)
BP 103/69   Pulse 80   Temp 98.6 F (37 C) (Oral)   Ht 5\' 6"  (1.676 m)   Wt 158 lb 6 oz (71.8 kg)   LMP 07/17/2016 (Approximate)   BMI 25.56 kg/m    Subjective:    Patient ID: Alexa Leon, female    DOB: 1976-01-20, 40 y.o.   MRN: [id number]  HPI: Alexa Leon is a 40 y.o. female presenting on 09/04/2016 for Anxiety followup   HPI Anxiety and depression follow-up. Patient is coming in for recheck on her anxiety and depression. She is feeling Better Than What She Was before. She Is Not Having A Lot Of the Crying Episodes and She Is A Lot More Motivated to Do Stuff with Her Family and Kids. She Is Feeling A Lot Better but Still Has the Occasional Anxiety Attack over the past Couple Weeks and Still Has Some Moments Where She Feels More down Than She Feels like Is Normal. She Denies Any Suicidal Ideations or Thoughts of Hurting Herself. She Would like to Try Increasing It to See If She Can Get Additional Benefit.  Relevant past medical, surgical, family and social history reviewed and updated as indicated. Interim medical history since our last visit reviewed. Allergies and medications reviewed and updated.  Review of Systems  Constitutional: Negative for chills and fever.  HENT: Negative for congestion, ear discharge and ear pain.   Eyes: Negative for redness and visual disturbance.  Respiratory: Negative for chest tightness and shortness of breath.   Cardiovascular: Negative for chest pain and leg swelling.  Genitourinary: Negative for difficulty urinating and dysuria.  Musculoskeletal: Negative for back pain and gait problem.  Skin: Negative for rash.  Neurological: Negative for light-headedness and headaches.  Psychiatric/Behavioral: Positive for dysphoric mood. Negative for agitation, behavioral problems, self-injury, sleep disturbance and suicidal ideas. The patient is nervous/anxious.   All other systems reviewed and are negative.  Per HPI unless specifically  indicated above    Medication List       Accurate as of 09/04/16  4:40 PM. Always use your most recent med list.          escitalopram 20 MG tablet Commonly known as:  LEXAPRO Take 1 tablet (20 mg total) by mouth daily.   hydrOXYzine 25 MG tablet Commonly known as:  ATARAX/VISTARIL Take 1 tablet (25 mg total) by mouth 3 (three) times daily as needed.   ibuprofen 800 MG tablet Commonly known as:  ADVIL,MOTRIN Take 1 tablet (800 mg total) by mouth every 8 (eight) hours as needed for moderate pain.          Objective:    BP 103/69   Pulse 80   Temp 98.6 F (37 C) (Oral)   Ht 5\' 6"  (1.676 m)   Wt 158 lb 6 oz (71.8 kg)   LMP 07/17/2016 (Approximate)   BMI 25.56 kg/m   Wt Readings from Last 3 Encounters:  09/04/16 158 lb 6 oz (71.8 kg)  08/07/16 158 lb 12.8 oz (72 kg)  05/30/15 158 lb (71.7 kg)    Physical Exam  Constitutional: She is oriented to person, place, and time. She appears well-developed and well-nourished. No distress.  Eyes: Conjunctivae are normal.  Cardiovascular: Normal rate, regular rhythm, normal heart sounds and intact distal pulses.   No murmur heard. Pulmonary/Chest: Effort normal and breath sounds normal. No respiratory distress. She has no wheezes.  Musculoskeletal: Normal range of motion. She exhibits no edema or tenderness.  Neurological: She is alert and  oriented to person, place, and time. Coordination normal.  Skin: Skin is warm and dry. No rash noted. She is not diaphoretic.  Psychiatric: Her behavior is normal. Judgment and thought content normal. Her mood appears anxious. She exhibits a depressed mood. She expresses no suicidal ideation. She expresses no suicidal plans.  Nursing note and vitals reviewed.     Assessment & Plan:   Problem List Items Addressed This Visit      Other   Anxiety and depression - Primary    Increased Lexapro to 20 mg      Relevant Medications   escitalopram (LEXAPRO) 20 MG tablet    Other Visit  Diagnoses   None.      Follow up plan: Return in about 4 weeks (around 10/02/2016), or if symptoms worsen or fail to improve, for Anxiety depression recheck.  Counseling provided for all of the vaccine components No orders of the defined types were placed in this encounter.   Arville Care, MD [facility] 09/04/2016, 4:40 PM

## 2016-09-09 NOTE — Assessment & Plan Note (Signed)
Increased Lexapro to 20 mg.

## 2016-09-26 ENCOUNTER — Ambulatory Visit: Payer: BC Managed Care – PPO | Admitting: Family Medicine

## 2016-11-19 ENCOUNTER — Other Ambulatory Visit: Payer: Self-pay | Admitting: Family Medicine

## 2016-11-19 DIAGNOSIS — F419 Anxiety disorder, unspecified: Principal | ICD-10-CM

## 2016-11-19 DIAGNOSIS — F329 Major depressive disorder, single episode, unspecified: Secondary | ICD-10-CM

## 2016-11-21 ENCOUNTER — Other Ambulatory Visit: Payer: Self-pay | Admitting: Family Medicine

## 2016-11-21 DIAGNOSIS — F329 Major depressive disorder, single episode, unspecified: Secondary | ICD-10-CM

## 2016-11-21 DIAGNOSIS — F419 Anxiety disorder, unspecified: Principal | ICD-10-CM

## 2016-11-27 ENCOUNTER — Other Ambulatory Visit: Payer: Self-pay | Admitting: Family Medicine

## 2016-11-27 DIAGNOSIS — F329 Major depressive disorder, single episode, unspecified: Secondary | ICD-10-CM

## 2016-11-27 DIAGNOSIS — F419 Anxiety disorder, unspecified: Principal | ICD-10-CM

## 2016-12-19 ENCOUNTER — Other Ambulatory Visit: Payer: Self-pay | Admitting: Family Medicine

## 2016-12-19 DIAGNOSIS — F419 Anxiety disorder, unspecified: Principal | ICD-10-CM

## 2016-12-19 DIAGNOSIS — F329 Major depressive disorder, single episode, unspecified: Secondary | ICD-10-CM

## 2016-12-22 ENCOUNTER — Other Ambulatory Visit: Payer: Self-pay | Admitting: Family Medicine

## 2016-12-22 DIAGNOSIS — F329 Major depressive disorder, single episode, unspecified: Secondary | ICD-10-CM

## 2016-12-22 DIAGNOSIS — F419 Anxiety disorder, unspecified: Principal | ICD-10-CM

## 2017-02-12 ENCOUNTER — Encounter: Payer: Self-pay | Admitting: Family Medicine

## 2017-02-12 ENCOUNTER — Ambulatory Visit (INDEPENDENT_AMBULATORY_CARE_PROVIDER_SITE_OTHER): Payer: BC Managed Care – PPO | Admitting: Family Medicine

## 2017-02-12 VITALS — BP 100/69 | HR 81 | Temp 98.1°F | Ht 66.0 in | Wt 170.1 lb

## 2017-02-12 DIAGNOSIS — B078 Other viral warts: Secondary | ICD-10-CM | POA: Diagnosis not present

## 2017-02-12 DIAGNOSIS — F329 Major depressive disorder, single episode, unspecified: Secondary | ICD-10-CM

## 2017-02-12 DIAGNOSIS — F418 Other specified anxiety disorders: Secondary | ICD-10-CM

## 2017-02-12 DIAGNOSIS — F419 Anxiety disorder, unspecified: Principal | ICD-10-CM

## 2017-02-12 MED ORDER — BUPROPION HCL ER (SR) 150 MG PO TB12: 150.0000 mg | ORAL_TABLET | Freq: Two times a day (BID) | ORAL | 2 refills | Status: DC

## 2017-02-12 NOTE — Progress Notes (Signed)
BP 100/69   Pulse 81   Temp 98.1 F (36.7 C) (Oral)   Ht 5\' 6"  (1.676 m)   Wt 170 lb 2 oz (77.2 kg)   LMP 01/22/2017   BMI 27.46 kg/m    Subjective:    Patient ID: Alexa HedgesBonnie Ellerbrock, female    DOB: May 16, 1976, 41 y.o.   MRN: [id number]  HPI: Alexa Leon is a 41 y.o. female presenting on 02/12/2017 for Wart on hand   HPI Wart on hand Patient has a wart on right third MCP joint that has been there for the past 3 months. She has tried a couple of different home remedies and it started to crack but has not resolved at all. She denies any fevers or chills or redness or warmth surrounding the site. She has not had warts like this before and this one has seemed to get pretty large on her.  Anxiety and depression Patient is coming for anxiety depression recheck. She says her anxiety and depression is doing very well and she denies any suicidal ideations. She is very happy with her medication and how to manage his anxiety depression but she is having a lot of sexual side effects and decreased sexual drive. She would like to try adding Wellbutrin which she has done before which helped her have increased sexual drive.  Relevant past medical, surgical, family and social history reviewed and updated as indicated. Interim medical history since our last visit reviewed. Allergies and medications reviewed and updated.  Review of Systems  Constitutional: Negative for chills and fever.  Respiratory: Negative for chest tightness and shortness of breath.   Cardiovascular: Negative for chest pain and leg swelling.  Musculoskeletal: Negative for back pain and gait problem.  Skin: Negative for rash.  Neurological: Negative for light-headedness and headaches.  Psychiatric/Behavioral: Negative for agitation, behavioral problems, dysphoric mood, self-injury, sleep disturbance and suicidal ideas. The patient is not nervous/anxious.   All other systems reviewed and are negative.   Per HPI unless  specifically indicated above     Objective:    BP 100/69   Pulse 81   Temp 98.1 F (36.7 C) (Oral)   Ht 5\' 6"  (1.676 m)   Wt 170 lb 2 oz (77.2 kg)   LMP 01/22/2017   BMI 27.46 kg/m   Wt Readings from Last 3 Encounters:  02/12/17 170 lb 2 oz (77.2 kg)  09/04/16 158 lb 6 oz (71.8 kg)  08/07/16 158 lb 12.8 oz (72 kg)    Physical Exam  Constitutional: She is oriented to person, place, and time. She appears well-developed and well-nourished. No distress.  Eyes: Conjunctivae are normal.  Musculoskeletal: Normal range of motion. She exhibits no edema.  Neurological: She is alert and oriented to person, place, and time. Coordination normal.  Skin: Skin is warm and dry. No rash noted. She is not diaphoretic.  Psychiatric: She has a normal mood and affect. Her behavior is normal. Her mood appears not anxious. She does not exhibit a depressed mood. She expresses no suicidal ideation. She expresses no suicidal plans.  Nursing note and vitals reviewed.   Wart cryotherapy: Patient is coming in today for wart cryotherapy. Wart was scraped with using an 11 blade and then 3 bursts of 15 seconds using liquid nitrogen were used, patient tolerated well, return in 2 weeks if needed.    Assessment & Plan:   Problem List Items Addressed This Visit      Other   Anxiety and depression - Primary  Relevant Medications   buPROPion (WELLBUTRIN SR) 150 MG 12 hr tablet    Other Visit Diagnoses    Common wart         Return in 2-3 weeks for wart removal  Follow up plan: Return in about 3 months (around 05/15/2017), or if symptoms worsen or fail to improve, for Anxiety depression recheck.  Counseling provided for all of the vaccine components No orders of the defined types were placed in this encounter.   Arville Care, MD [facility] 02/12/2017, 2:16 PM

## 2017-02-26 ENCOUNTER — Encounter: Payer: Self-pay | Admitting: Family Medicine

## 2017-02-26 ENCOUNTER — Ambulatory Visit (INDEPENDENT_AMBULATORY_CARE_PROVIDER_SITE_OTHER): Payer: BC Managed Care – PPO | Admitting: Family Medicine

## 2017-02-26 VITALS — BP 111/68 | HR 84 | Temp 97.5°F | Ht 66.0 in | Wt 169.4 lb

## 2017-02-26 DIAGNOSIS — F418 Other specified anxiety disorders: Secondary | ICD-10-CM | POA: Diagnosis not present

## 2017-02-26 DIAGNOSIS — F329 Major depressive disorder, single episode, unspecified: Secondary | ICD-10-CM

## 2017-02-26 DIAGNOSIS — F419 Anxiety disorder, unspecified: Secondary | ICD-10-CM

## 2017-02-26 DIAGNOSIS — B078 Other viral warts: Secondary | ICD-10-CM | POA: Diagnosis not present

## 2017-02-26 NOTE — Progress Notes (Signed)
BP 111/68   Pulse 84   Temp 97.5 F (36.4 C) (Oral)   Ht 5\' 6"  (1.676 m)   Wt 169 lb 6 oz (76.8 kg)   BMI 27.34 kg/m    Subjective:    Patient ID: Alexa Leon, female    DOB: 08-07-1976, 41 y.o.   MRN: [id number]  HPI: Alexa Leon is a 41 y.o. female presenting on 02/26/2017 for Anxiety & Depression (2 week followup since beginning Wellbutrin)   HPI Wart on hand Patient has a wart on right third MCP joint that has been there for the past 3 months. She has tried a couple of different home remedies and it started to crack but has not resolved at all. She denies any fevers or chills or redness or warmth surrounding the site. She has not had warts like this before and this one has seemed to get pretty large on her.We did cryotherapy 2 weeks ago and the wart is greatly improved from where it was and is about half as thick. She is coming here today to do repeat treatment  Anxiety and depression Patient is coming for anxiety depression recheck. She is doing very well on the Wellbutrin is very happy with it and feels like her sex life is going better with her husband.  Relevant past medical, surgical, family and social history reviewed and updated as indicated. Interim medical history since our last visit reviewed. Allergies and medications reviewed and updated.  Review of Systems  Constitutional: Negative for chills and fever.  Respiratory: Negative for chest tightness and shortness of breath.   Cardiovascular: Negative for chest pain and leg swelling.  Musculoskeletal: Negative for back pain and gait problem.  Skin: Negative for rash.  Neurological: Negative for light-headedness and headaches.  Psychiatric/Behavioral: Negative for agitation, behavioral problems, dysphoric mood, self-injury, sleep disturbance and suicidal ideas. The patient is not nervous/anxious.   All other systems reviewed and are negative.   Per HPI unless specifically indicated above     Objective:    BP 111/68   Pulse 84   Temp 97.5 F (36.4 C) (Oral)   Ht 5\' 6"  (1.676 m)   Wt 169 lb 6 oz (76.8 kg)   BMI 27.34 kg/m   Wt Readings from Last 3 Encounters:  02/26/17 169 lb 6 oz (76.8 kg)  02/12/17 170 lb 2 oz (77.2 kg)  09/04/16 158 lb 6 oz (71.8 kg)    Physical Exam  Constitutional: She is oriented to person, place, and time. She appears well-developed and well-nourished. No distress.  Eyes: Conjunctivae are normal.  Musculoskeletal: Normal range of motion. She exhibits no edema.  Neurological: She is alert and oriented to person, place, and time. Coordination normal.  Skin: Skin is warm and dry. Lesion (Common wart on right hand overlying third MCP dorsally) noted. No rash noted. She is not diaphoretic.  Psychiatric: She has a normal mood and affect. Her behavior is normal. Her mood appears not anxious. She does not exhibit a depressed mood. She expresses no suicidal ideation. She expresses no suicidal plans.  Nursing note and vitals reviewed.   Wart cryotherapy: Patient is coming in today for wart cryotherapy. 3 bursts of 15 seconds using liquid nitrogen were used, patient tolerated well, return in 2 weeks if needed.    Assessment & Plan:   Problem List Items Addressed This Visit      Other   Anxiety and depression    Other Visit Diagnoses    Common wart    -  Primary     Return in 2-3 weeks for wart Cryotherapy as needed  Follow up plan: Return if symptoms worsen or fail to improve.  Counseling provided for all of the vaccine components No orders of the defined types were placed in this encounter.   Arville CareJoshua Dettinger, MD [facility] 02/26/2017, 1:40 PM

## 2017-03-04 ENCOUNTER — Other Ambulatory Visit: Payer: Self-pay | Admitting: Family Medicine

## 2017-03-04 DIAGNOSIS — F329 Major depressive disorder, single episode, unspecified: Secondary | ICD-10-CM

## 2017-03-04 DIAGNOSIS — F419 Anxiety disorder, unspecified: Principal | ICD-10-CM

## 2017-03-06 ENCOUNTER — Other Ambulatory Visit: Payer: Self-pay | Admitting: *Deleted

## 2017-03-06 DIAGNOSIS — F329 Major depressive disorder, single episode, unspecified: Secondary | ICD-10-CM

## 2017-03-06 DIAGNOSIS — F419 Anxiety disorder, unspecified: Principal | ICD-10-CM

## 2017-03-06 MED ORDER — ESCITALOPRAM OXALATE 20 MG PO TABS: 20.0000 mg | ORAL_TABLET | Freq: Every day | ORAL | 2 refills | Status: DC

## 2017-06-10 ENCOUNTER — Other Ambulatory Visit: Payer: Self-pay | Admitting: Family Medicine

## 2017-06-10 DIAGNOSIS — F419 Anxiety disorder, unspecified: Principal | ICD-10-CM

## 2017-06-10 DIAGNOSIS — F329 Major depressive disorder, single episode, unspecified: Secondary | ICD-10-CM

## 2017-06-10 DIAGNOSIS — F32A Depression, unspecified: Secondary | ICD-10-CM

## 2017-07-16 ENCOUNTER — Other Ambulatory Visit: Payer: Self-pay | Admitting: Family Medicine

## 2017-07-16 DIAGNOSIS — F419 Anxiety disorder, unspecified: Principal | ICD-10-CM

## 2017-07-16 DIAGNOSIS — F32A Depression, unspecified: Secondary | ICD-10-CM

## 2017-07-16 DIAGNOSIS — F329 Major depressive disorder, single episode, unspecified: Secondary | ICD-10-CM

## 2017-08-11 ENCOUNTER — Other Ambulatory Visit: Payer: Self-pay | Admitting: Family Medicine

## 2017-08-11 DIAGNOSIS — F419 Anxiety disorder, unspecified: Principal | ICD-10-CM

## 2017-08-11 DIAGNOSIS — F32A Depression, unspecified: Secondary | ICD-10-CM

## 2017-08-11 DIAGNOSIS — F329 Major depressive disorder, single episode, unspecified: Secondary | ICD-10-CM

## 2017-08-12 NOTE — Telephone Encounter (Signed)
TC to verify if she needs wellbutrin or lexapro refilled

## 2017-08-13 NOTE — Telephone Encounter (Signed)
Last seen 3.15.18  Dr Louanne Skyeettinger

## 2017-08-22 ENCOUNTER — Other Ambulatory Visit: Payer: Self-pay | Admitting: Family Medicine

## 2017-08-22 DIAGNOSIS — F32A Depression, unspecified: Secondary | ICD-10-CM

## 2017-08-22 DIAGNOSIS — F419 Anxiety disorder, unspecified: Principal | ICD-10-CM

## 2017-08-22 DIAGNOSIS — F329 Major depressive disorder, single episode, unspecified: Secondary | ICD-10-CM

## 2017-09-28 ENCOUNTER — Ambulatory Visit (INDEPENDENT_AMBULATORY_CARE_PROVIDER_SITE_OTHER): Payer: BC Managed Care – PPO | Admitting: Family Medicine

## 2017-09-28 ENCOUNTER — Encounter: Payer: Self-pay | Admitting: Family Medicine

## 2017-09-28 VITALS — BP 106/76 | HR 84 | Temp 99.0°F | Ht 66.0 in | Wt 175.0 lb

## 2017-09-28 DIAGNOSIS — R5381 Other malaise: Secondary | ICD-10-CM

## 2017-09-28 DIAGNOSIS — F419 Anxiety disorder, unspecified: Secondary | ICD-10-CM | POA: Diagnosis not present

## 2017-09-28 DIAGNOSIS — R238 Other skin changes: Secondary | ICD-10-CM

## 2017-09-28 DIAGNOSIS — F329 Major depressive disorder, single episode, unspecified: Secondary | ICD-10-CM

## 2017-09-28 DIAGNOSIS — Z13220 Encounter for screening for lipoid disorders: Secondary | ICD-10-CM | POA: Diagnosis not present

## 2017-09-28 DIAGNOSIS — R233 Spontaneous ecchymoses: Secondary | ICD-10-CM

## 2017-09-28 DIAGNOSIS — R5383 Other fatigue: Secondary | ICD-10-CM

## 2017-09-28 MED ORDER — BUPROPION HCL ER (SR) 200 MG PO TB12
200.0000 mg | ORAL_TABLET | Freq: Two times a day (BID) | ORAL | 1 refills | Status: DC
Start: 2017-09-28 — End: 2018-01-27

## 2017-09-28 MED ORDER — ESCITALOPRAM OXALATE 20 MG PO TABS: 20.0000 mg | ORAL_TABLET | Freq: Every day | ORAL | 1 refills | Status: DC

## 2017-09-28 NOTE — Progress Notes (Signed)
BP 106/76   Pulse 84   Temp 99 F (37.2 C) (Oral)   Ht '5\' 6"'  (1.676 m)   Wt 175 lb (79.4 kg)   LMP 09/14/2017 (Approximate)   BMI 28.25 kg/m    Subjective:    Patient ID: Alexa Leon, female    DOB: 1976/09/15, 41 y.o.   MRN: [id number]  HPI: Alexa Leon is a 41 y.o. female presenting on 09/28/2017 for Bruising on thighs x 2 months, fatigue, night sweats, headac   HPI Bruising on thighs Patient comes in complaining of bruising on her upper thighs that she has been having recurrently over the past 2 months. She has not had any major trauma and then she will get these massive bruises on her thighs. She thinks most recently she was itching from bug bites and a conservator and ended up with his large bruises over the past few days. She says it'll come and go but they have been continually coming back over the past couple months. She also complains of fatigue and night sweats and headaches. She denies any rashes anywhere else. She does not know whether she had any tick bites or not but [place] so she very well could've. She is also been having headaches frequently that are generalized headaches. She denies any fevers or chills or shortness of breath or wheezing or cough. She denies any urinary symptoms or bowel issues.  Anxiety and depression recheck Patient says her son was recently diagnosed with autism and is high functioning autism but dealing with that and going through the school system to get his IEP has caused a lot more stress for her especially when she is out in social situations. She denies any suicidal ideations or thoughts of hurting herself. She mostly just her sister feel panicky and anxiety when she goes out in social situations and then has to leave them or drink couple beers to calm herself down.  Relevant past medical, surgical, family and social history reviewed and updated as indicated. Interim medical history since our last visit  reviewed. Allergies and medications reviewed and updated.  Review of Systems  Constitutional: Positive for fatigue. Negative for chills and fever.  HENT: Negative for congestion, ear discharge and ear pain.   Eyes: Negative for redness and visual disturbance.  Respiratory: Negative for chest tightness and shortness of breath.   Cardiovascular: Negative for chest pain and leg swelling.  Gastrointestinal: Negative for abdominal pain, constipation, diarrhea, nausea and vomiting.  Genitourinary: Negative for decreased urine volume, difficulty urinating, dysuria, frequency, urgency, vaginal discharge and vaginal pain.  Musculoskeletal: Negative for back pain and gait problem.  Skin: Negative for color change and rash.  Neurological: Negative for dizziness, light-headedness and headaches.  Psychiatric/Behavioral: Positive for dysphoric mood and sleep disturbance. Negative for agitation, behavioral problems, self-injury and suicidal ideas. The patient is nervous/anxious.   All other systems reviewed and are negative.   Per HPI unless specifically indicated above        Objective:    BP 106/76   Pulse 84   Temp 99 F (37.2 C) (Oral)   Ht '5\' 6"'  (1.676 m)   Wt 175 lb (79.4 kg)   LMP 09/14/2017 (Approximate)   BMI 28.25 kg/m   Wt Readings from Last 3 Encounters:  09/28/17 175 lb (79.4 kg)  02/26/17 169 lb 6 oz (76.8 kg)  02/12/17 170 lb 2 oz (77.2 kg)    Physical Exam  Constitutional: She is oriented to person, place,  and time. She appears well-developed and well-nourished. No distress.  HENT:  Right Ear: External ear normal.  Left Ear: External ear normal.  Nose: Nose normal.  Mouth/Throat: Oropharynx is clear and moist. No oropharyngeal exudate.  Eyes: Conjunctivae are normal.  Neck: Neck supple. No thyromegaly present.  Cardiovascular: Normal rate, regular rhythm, normal heart sounds and intact distal pulses.   No murmur heard. Pulmonary/Chest: Effort normal and breath  sounds normal. No respiratory distress. She has no wheezes. She has no rales.  Musculoskeletal: Normal range of motion. She exhibits no edema.  Lymphadenopathy:    She has no cervical adenopathy.  Neurological: She is alert and oriented to person, place, and time. Coordination normal.  Skin: Skin is warm and dry. Bruising (Large bruises extending on both sides of both of her thighs.) noted. No rash noted. She is not diaphoretic.  Psychiatric: She has a normal mood and affect. Her behavior is normal.  Nursing note and vitals reviewed.     Assessment & Plan:   Problem List Items Addressed This Visit      Other   Anxiety and depression   Relevant Medications   escitalopram (LEXAPRO) 20 MG tablet   buPROPion (WELLBUTRIN SR) 200 MG 12 hr tablet   Other Relevant Orders   CBC with Differential/Platelet   Thyroid Panel With TSH    Other Visit Diagnoses    Abnormal bruising    -  Primary   Relevant Orders   CBC with Differential/Platelet   CMP14+EGFR   Lyme Ab/Western Blot Reflex   Rocky mtn spotted fvr abs pnl(IgG+IgM)   Sedimentation rate   C-reactive protein   Alpha-Gal Panel   Malaise and fatigue       Relevant Orders   CBC with Differential/Platelet   CMP14+EGFR   Lyme Ab/Western Blot Reflex   Rocky mtn spotted fvr abs pnl(IgG+IgM)   Sedimentation rate   C-reactive protein   Alpha-Gal Panel   Lipid screening       Relevant Orders   Lipid panel       Follow up plan: Return in about 4 weeks (around 10/26/2017), or if symptoms worsen or fail to improve, for Pap smear and follow-up anxiety and depression.  Counseling provided for all of the vaccine components Orders Placed This Encounter  Procedures  . CBC with Differential/Platelet  . Thyroid Panel With TSH  . CMP14+EGFR  . Lipid panel  . Lyme Ab/Western Blot Reflex  . Rocky mtn spotted fvr abs pnl(IgG+IgM)  . Sedimentation rate  . C-reactive protein  . Alpha-Gal Panel    Caryl Pina, MD [facility] 09/28/2017, 3:24 PM

## 2017-09-29 NOTE — Addendum Note (Signed)
Addended by: Arville Care on: 09/29/2017 07:52 AM   Modules accepted: Orders

## 2017-09-30 ENCOUNTER — Telehealth: Payer: Self-pay | Admitting: Family Medicine

## 2017-09-30 MED ORDER — DOXYCYCLINE HYCLATE 100 MG PO TABS: 100.0000 mg | ORAL_TABLET | Freq: Two times a day (BID) | ORAL | 0 refills | Status: DC

## 2017-09-30 NOTE — Telephone Encounter (Signed)
lmtcb

## 2017-09-30 NOTE — Telephone Encounter (Signed)
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 Arville CareJoshua Dettinger, MD [facility] 09/30/2017, 7:44 AM

## 2017-10-01 LAB — CMP14+EGFR
A/G RATIO: 1.4 (ref 1.2–2.2)
ALBUMIN: 4.2 g/dL (ref 3.5–5.5)
ALT: 15 IU/L (ref 0–32)
AST: 22 IU/L (ref 0–40)
Alkaline Phosphatase: 65 IU/L (ref 39–117)
BILIRUBIN TOTAL: 0.3 mg/dL (ref 0.0–1.2)
BUN / CREAT RATIO: 13 (ref 9–23)
BUN: 11 mg/dL (ref 6–20)
CHLORIDE: 100 mmol/L (ref 96–106)
CO2: 23 mmol/L (ref 20–29)
Calcium: 9.8 mg/dL (ref 8.7–10.2)
Creatinine, Ser: 0.87 mg/dL (ref 0.57–1.00)
GFR calc non Af Amer: 84 mL/min/{1.73_m2} (ref >59)
GFR, EST AFRICAN AMERICAN: 97 mL/min/{1.73_m2} (ref >59)
Globulin, Total: 3 g/dL (ref 1.5–4.5)
Glucose: 87 mg/dL (ref 65–99)
POTASSIUM: 4.9 mmol/L (ref 3.5–5.2)
SODIUM: 141 mmol/L (ref 134–144)
TOTAL PROTEIN: 7.2 g/dL (ref 6.0–8.5)

## 2017-10-01 LAB — CBC WITH DIFFERENTIAL/PLATELET
BASOS ABS: 0.1 10*3/uL (ref 0.0–0.2)
Basos: 1 %
EOS (ABSOLUTE): 1.1 10*3/uL — AB (ref 0.0–0.4)
Eos: 11 %
Hematocrit: 39.9 % (ref 34.0–46.6)
Hemoglobin: 12.9 g/dL (ref 11.1–15.9)
IMMATURE GRANS (ABS): 0 10*3/uL (ref 0.0–0.1)
Immature Granulocytes: 0 %
LYMPHS: 29 %
Lymphocytes Absolute: 2.8 10*3/uL (ref 0.7–3.1)
MCH: 28.2 pg (ref 26.6–33.0)
MCHC: 32.3 g/dL (ref 31.5–35.7)
MCV: 87 fL (ref 79–97)
Monocytes Absolute: 0.6 10*3/uL (ref 0.1–0.9)
Monocytes: 6 %
NEUTROS ABS: 5.3 10*3/uL (ref 1.4–7.0)
NEUTROS PCT: 53 %
Platelets: 295 10*3/uL (ref 150–379)
RBC: 4.57 x10E6/uL (ref 3.77–5.28)
RDW: 14 % (ref 12.3–15.4)
WBC: 9.9 10*3/uL (ref 3.4–10.8)

## 2017-10-01 LAB — THYROID PANEL WITH TSH
FREE THYROXINE INDEX: 1.6 (ref 1.2–4.9)
T3 Uptake Ratio: 25 % (ref 24–39)
T4, Total: 6.4 ug/dL (ref 4.5–12.0)
TSH: 1.95 u[IU]/mL (ref 0.450–4.500)

## 2017-10-01 LAB — LYME AB/WESTERN BLOT REFLEX
LYME DISEASE AB, QUANT, IGM: <0.8 index (ref 0.00–0.79)
Lyme IgG/IgM Ab: <0.91 {ISR} (ref 0.00–0.90)

## 2017-10-01 LAB — LIPID PANEL
Chol/HDL Ratio: 3.1 ratio (ref 0.0–4.4)
Cholesterol, Total: 183 mg/dL (ref 100–199)
HDL: 59 mg/dL (ref >39)
LDL Calculated: 83 mg/dL (ref 0–99)
Triglycerides: 204 mg/dL — ABNORMAL HIGH (ref 0–149)
VLDL Cholesterol Cal: 41 mg/dL — ABNORMAL HIGH (ref 5–40)

## 2017-10-01 LAB — ALPHA-GAL PANEL
ALPHA GAL IGE: 0.75 kU/L — AB (ref <0.35)
Beef (Bos spp) IgE: 0.11 kU/L (ref <0.35)
Class Interpretation: 0
Lamb/Mutton (Ovis spp) IgE: <0.1 kU/L (ref <0.35)
PORK CLASS INTERPRETATION: 0
Pork (Sus spp) IgE: <0.1 kU/L (ref <0.35)

## 2017-10-01 LAB — ROCKY MTN SPOTTED FVR ABS PNL(IGG+IGM)
RMSF IgG: NEGATIVE
RMSF IgM: 0.92 index — ABNORMAL HIGH (ref 0.00–0.89)

## 2017-10-01 LAB — SEDIMENTATION RATE: SED RATE: 2 mm/h (ref 0–32)

## 2017-10-01 LAB — C-REACTIVE PROTEIN: CRP: 3.2 mg/L (ref 0.0–4.9)

## 2017-10-02 NOTE — Telephone Encounter (Signed)
Patient is aware of labs and Doxycycline prescription.  She will pick up.  Told her we would call with the results of gout and alpha gal when we receive.

## 2017-10-05 ENCOUNTER — Telehealth: Payer: Self-pay | Admitting: Family Medicine

## 2017-10-07 NOTE — Telephone Encounter (Signed)
Attempted to contact patient - NA °

## 2017-10-22 ENCOUNTER — Ambulatory Visit (INDEPENDENT_AMBULATORY_CARE_PROVIDER_SITE_OTHER): Payer: BC Managed Care – PPO | Admitting: Family Medicine

## 2017-10-22 ENCOUNTER — Encounter: Payer: Self-pay | Admitting: Family Medicine

## 2017-10-22 VITALS — BP 111/77 | HR 72 | Temp 97.8°F | Ht 66.0 in | Wt 174.0 lb

## 2017-10-22 DIAGNOSIS — F339 Major depressive disorder, recurrent, unspecified: Secondary | ICD-10-CM

## 2017-10-22 DIAGNOSIS — Z01419 Encounter for gynecological examination (general) (routine) without abnormal findings: Secondary | ICD-10-CM

## 2017-10-22 NOTE — Telephone Encounter (Signed)
Multiple attempts have been made to contact pt without any return calls, will close encounter.

## 2017-10-22 NOTE — Progress Notes (Signed)
BP 111/77   Pulse 72   Temp 97.8 F (36.6 C) (Oral)   Ht 5\' 6"  (1.676 m)   Wt 174 lb (78.9 kg)   BMI 28.08 kg/m    Subjective:    Patient ID: Alexa HedgesBonnie Brazeau, female    DOB: 04/10/1976, 41 y.o.   MRN: [id number]  HPI: Alexa Leon is a 41 y.o. female presenting on 10/22/2017 for Gynecologic Exam and Anxiety   HPI Well woman exam and Pap smear Patient is coming in today for well woman exam and Pap smear. Patient denies any chest pain, shortness of breath, headaches or vision issues, abdominal complaints, diarrhea, nausea, vomiting, or joint issues.  Patient denies any breast lumps or bumps or nodules or discharge or skin changes.  She denies any vaginal discharge or irritation or bleeding outside of her normal periods.  Her husband had a vasectomy and that is why she is off birth control currently  Anxiety and depression recheck Patient is coming in for recheck for anxiety depression.  She says her depression is doing a lot better but she still feels like every now and then she has anxiety attacks where she will start sobbing uncontrollably for no reason.  She says it has been less frequent and she has been doing a lot better in general overall.  She says she is energy to get up and do things.  She has been a lot more motivated and denies any suicidal ideations. [facility] 2[facility] 10/22/2017 09/28/2017 09/28/2017 02/26/2017 02/12/2017  Decreased Interest 1 1 0 0 0  Down, Depressed, Hopeless 1 1 0 0 0  PHQ - 2 Score 2 2 0 0 0  Altered sleeping 0 2 - - -  Tired, decreased energy 1 2 - - -  Change in appetite 0 0 - - -  Feeling bad or failure about yourself  0 0 - - -  Trouble concentrating 1 1 - - -  Moving slowly or fidgety/restless 0 0 - - -  Suicidal thoughts 0 0 - - -  PHQ-9 Score 4 7 - - -  Difficult doing work/chores - Not difficult at all - - -     Relevant past medical, surgical, family and social history reviewed and updated as indicated. Interim medical history  since our last visit reviewed. Allergies and medications reviewed and updated.  Review of Systems  Constitutional: Negative for chills and fever.  Eyes: Negative for visual disturbance.  Respiratory: Negative for chest tightness and shortness of breath.   Cardiovascular: Negative for chest pain and leg swelling.  Musculoskeletal: Negative for back pain and gait problem.  Skin: Negative for rash.  Neurological: Negative for dizziness, light-headedness and headaches.  Psychiatric/Behavioral: Positive for dysphoric mood. Negative for agitation, behavioral problems, self-injury, sleep disturbance and suicidal ideas. The patient is nervous/anxious.   All other systems reviewed and are negative.   Per HPI unless specifically indicated above    Objective:    BP 111/77   Pulse 72   Temp 97.8 F (36.6 C) (Oral)   Ht 5\' 6"  (1.676 m)   Wt 174 lb (78.9 kg)   BMI 28.08 kg/m   Wt Readings from Last 3 Encounters:  10/22/17 174 lb (78.9 kg)  09/28/17 175 lb (79.4 kg)  02/26/17 169 lb 6 oz (76.8 kg)    Physical Exam  Constitutional: She is oriented to person, place, and time. She appears well-developed and well-nourished. No distress.  Eyes: Conjunctivae are normal.  Neck: Neck supple.  No thyromegaly present.  Cardiovascular: Normal rate, regular rhythm, normal heart sounds and intact distal pulses.  No murmur heard. Pulmonary/Chest: Effort normal and breath sounds normal. No respiratory distress. She has no wheezes. She has no rales.  Musculoskeletal: Normal range of motion. She exhibits no edema.  Lymphadenopathy:    She has no cervical adenopathy.  Neurological: She is alert and oriented to person, place, and time. Coordination normal.  Skin: Skin is warm and dry. No rash noted. She is not diaphoretic.  Psychiatric: She has a normal mood and affect. Her behavior is normal.  Nursing note and vitals reviewed.     Assessment & Plan:   Problem List Items Addressed This Visit       Other   Depression, recurrent (HCC)    Other Visit Diagnoses    Well woman exam with routine gynecological exam    -  Primary   Relevant Orders   Pap IG, rfx HPV all pth      Follow up plan: Return in about 3 months (around 01/22/2018), or if symptoms worsen or fail to improve, for Depression recheck.  Counseling provided for all of the vaccine components No orders of the defined types were placed in this encounter.   Arville CareJoshua Cheralyn Oliver, MD [facility] 10/22/2017, 4:46 PM

## 2017-10-23 LAB — PAP IG, RFX HPV ALL PTH: PAP Smear Comment: 0

## 2017-11-03 ENCOUNTER — Other Ambulatory Visit: Payer: Self-pay | Admitting: Family Medicine

## 2017-11-03 NOTE — Telephone Encounter (Signed)
Last seen 10/22/17  Dr D

## 2017-12-16 ENCOUNTER — Other Ambulatory Visit: Payer: Self-pay | Admitting: Family Medicine

## 2017-12-16 DIAGNOSIS — F419 Anxiety disorder, unspecified: Principal | ICD-10-CM

## 2017-12-16 DIAGNOSIS — F32A Depression, unspecified: Secondary | ICD-10-CM

## 2017-12-16 DIAGNOSIS — F329 Major depressive disorder, single episode, unspecified: Secondary | ICD-10-CM

## 2018-01-27 ENCOUNTER — Other Ambulatory Visit: Payer: Self-pay | Admitting: Family Medicine

## 2018-01-27 DIAGNOSIS — F329 Major depressive disorder, single episode, unspecified: Secondary | ICD-10-CM

## 2018-01-27 DIAGNOSIS — F419 Anxiety disorder, unspecified: Principal | ICD-10-CM

## 2018-02-26 ENCOUNTER — Other Ambulatory Visit: Payer: Self-pay | Admitting: Family Medicine

## 2018-02-26 DIAGNOSIS — F419 Anxiety disorder, unspecified: Principal | ICD-10-CM

## 2018-02-26 DIAGNOSIS — F329 Major depressive disorder, single episode, unspecified: Secondary | ICD-10-CM

## 2018-03-29 ENCOUNTER — Other Ambulatory Visit: Payer: Self-pay | Admitting: Family Medicine

## 2018-03-29 DIAGNOSIS — F329 Major depressive disorder, single episode, unspecified: Secondary | ICD-10-CM

## 2018-03-29 DIAGNOSIS — F419 Anxiety disorder, unspecified: Principal | ICD-10-CM

## 2018-03-29 DIAGNOSIS — F32A Depression, unspecified: Secondary | ICD-10-CM

## 2018-04-29 ENCOUNTER — Other Ambulatory Visit: Payer: Self-pay | Admitting: Family Medicine

## 2018-04-29 DIAGNOSIS — F419 Anxiety disorder, unspecified: Principal | ICD-10-CM

## 2018-04-29 DIAGNOSIS — F329 Major depressive disorder, single episode, unspecified: Secondary | ICD-10-CM

## 2018-04-29 NOTE — Telephone Encounter (Signed)
Last seen 10/22/17  Alexa Leon

## 2018-05-26 ENCOUNTER — Other Ambulatory Visit: Payer: Self-pay | Admitting: Family Medicine

## 2018-05-26 DIAGNOSIS — F419 Anxiety disorder, unspecified: Principal | ICD-10-CM

## 2018-05-26 DIAGNOSIS — F329 Major depressive disorder, single episode, unspecified: Secondary | ICD-10-CM

## 2018-05-27 ENCOUNTER — Other Ambulatory Visit: Payer: Self-pay | Admitting: Family Medicine

## 2018-05-27 DIAGNOSIS — F329 Major depressive disorder, single episode, unspecified: Secondary | ICD-10-CM

## 2018-05-27 DIAGNOSIS — F419 Anxiety disorder, unspecified: Principal | ICD-10-CM

## 2018-06-30 ENCOUNTER — Other Ambulatory Visit: Payer: Self-pay | Admitting: Family Medicine

## 2018-06-30 DIAGNOSIS — F329 Major depressive disorder, single episode, unspecified: Secondary | ICD-10-CM

## 2018-06-30 DIAGNOSIS — F419 Anxiety disorder, unspecified: Principal | ICD-10-CM

## 2018-07-02 ENCOUNTER — Other Ambulatory Visit: Payer: Self-pay | Admitting: Family Medicine

## 2018-07-02 DIAGNOSIS — F329 Major depressive disorder, single episode, unspecified: Secondary | ICD-10-CM

## 2018-07-02 DIAGNOSIS — F419 Anxiety disorder, unspecified: Principal | ICD-10-CM

## 2018-07-02 NOTE — Telephone Encounter (Signed)
Will go ahead and do refill but have patient get an appointment to come back and see me

## 2018-07-02 NOTE — Telephone Encounter (Signed)
Las seen 10/22/17

## 2018-07-21 ENCOUNTER — Ambulatory Visit: Payer: BC Managed Care – PPO | Admitting: Family Medicine

## 2018-07-21 ENCOUNTER — Encounter: Payer: Self-pay | Admitting: Family Medicine

## 2018-07-21 VITALS — BP 110/75 | HR 86 | Temp 97.1°F | Ht 66.0 in | Wt 163.6 lb

## 2018-07-21 DIAGNOSIS — F329 Major depressive disorder, single episode, unspecified: Secondary | ICD-10-CM

## 2018-07-21 DIAGNOSIS — E663 Overweight: Secondary | ICD-10-CM | POA: Diagnosis not present

## 2018-07-21 DIAGNOSIS — Z13220 Encounter for screening for lipoid disorders: Secondary | ICD-10-CM

## 2018-07-21 DIAGNOSIS — F419 Anxiety disorder, unspecified: Secondary | ICD-10-CM | POA: Diagnosis not present

## 2018-07-21 DIAGNOSIS — Z131 Encounter for screening for diabetes mellitus: Secondary | ICD-10-CM

## 2018-07-21 MED ORDER — BUPROPION HCL ER (SR) 200 MG PO TB12: 200.0000 mg | ORAL_TABLET | Freq: Every day | ORAL | 3 refills | Status: DC

## 2018-07-21 MED ORDER — HYDROXYZINE HCL 25 MG PO TABS: 25.0000 mg | ORAL_TABLET | Freq: Three times a day (TID) | ORAL | 3 refills | Status: DC | PRN

## 2018-07-21 MED ORDER — ESCITALOPRAM OXALATE 20 MG PO TABS: 20.0000 mg | ORAL_TABLET | Freq: Every day | ORAL | 3 refills | Status: DC

## 2018-07-21 NOTE — Progress Notes (Signed)
BP 110/75   Pulse 86   Temp (!) 97.1 F (36.2 C) (Oral)   Ht '5\' 6"'  (1.676 m)   Wt 163 lb 9.6 oz (74.2 kg)   BMI 26.41 kg/m    Subjective:    Patient ID: Alexa Leon, female    DOB: [birth year], 42 y.o.   MRN: [id number]  HPI: Alexa Leon is a 42 y.o. female presenting on 07/21/2018 for Depression (6 month follow up) and Anxiety   HPI Depression and anxiety recheck Depression and anxiety recheck today in office.  She is currently using Wellbutrin once daily and Lexapro and hydroxyzine as needed.  She says she went on a family vacation with her son who has autistic and was able to handle a lot of the situations a lot better than she is ever been.  She is very happy with where she is at and denies any feelings of suicide or thoughts of hurting herself.  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  He is currently 4 and will going into pre-k and has an IEP  Relevant past medical, surgical, family and social history reviewed and updated as indicated. Interim medical history since our last visit reviewed. Allergies and medications reviewed and updated.  Review of Systems  Constitutional: Negative for chills and fever.  Eyes: Negative for visual disturbance.  Respiratory: Negative for chest tightness and shortness of breath.   Cardiovascular: Negative for chest pain and leg swelling.  Musculoskeletal: Negative for back pain and gait problem.  Skin: Negative for rash.  Neurological: Negative for light-headedness and headaches.  Psychiatric/Behavioral: Negative for agitation, behavioral problems, decreased concentration, self-injury, sleep disturbance and suicidal ideas. The patient is not nervous/anxious and is not hyperactive.   All other systems reviewed and are negative.   Per HPI unless specifically indicated above   Allergies as of 07/21/2018      Reactions   Amoxicillin Swelling   Swelling lips,  blisters in the throat      Medication List        Accurate as of 07/21/18  1:59 PM. Always use your most recent med list.          buPROPion 200 MG 12 hr tablet Commonly known as:  WELLBUTRIN SR Take 1 tablet (200 mg total) by mouth daily.   escitalopram 20 MG tablet Commonly known as:  LEXAPRO Take 1 tablet (20 mg total) by mouth daily.   hydrOXYzine 25 MG tablet Commonly known as:  ATARAX/VISTARIL Take 1 tablet (25 mg total) by mouth 3 (three) times daily as needed.          Objective:    BP 110/75   Pulse 86   Temp (!) 97.1 F (36.2 C) (Oral)   Ht '5\' 6"'  (1.676 m)   Wt 163 lb 9.6 oz (74.2 kg)   BMI 26.41 kg/m   Wt Readings from Last 3 Encounters:  07/21/18 163 lb 9.6 oz (74.2 kg)  10/22/17 174 lb (78.9 kg)  09/28/17 175 lb (79.4 kg)    Physical Exam  Constitutional: She is oriented to person, place, and time. She appears well-developed and well-nourished. No distress.  Eyes: Pupils are equal, round, and reactive to light. Conjunctivae and EOM are normal.  Neck: Neck supple. No thyromegaly present.  Cardiovascular: Normal rate, regular rhythm, normal heart sounds and intact distal pulses.  No murmur heard. Pulmonary/Chest: Effort normal and breath sounds normal. No  respiratory distress. She has no wheezes.  Musculoskeletal: Normal range of motion. She exhibits no edema.  Lymphadenopathy:    She has no cervical adenopathy.  Neurological: She is alert and oriented to person, place, and time. Coordination normal.  Skin: Skin is warm and dry. No rash noted. She is not diaphoretic.  Psychiatric: She has a normal mood and affect. Her behavior is normal.  Nursing note and vitals reviewed.       Assessment & Plan:   Problem List Items Addressed This Visit      Other   Overweight (BMI 25.0-29.9)    Other Visit Diagnoses    Anxiety and depression    -  Primary   Relevant Medications   hydrOXYzine (ATARAX/VISTARIL) 25 MG tablet   escitalopram (LEXAPRO) 20  MG tablet   buPROPion (WELLBUTRIN SR) 200 MG 12 hr tablet   Other Relevant Orders   CBC with Differential/Platelet   Diabetes mellitus screening       Relevant Orders   CMP14+EGFR   Lipid screening       Relevant Orders   Lipid panel      Continue current medications for anxiety depression and will do labs, see back in 6 months Follow up plan: Return in about 6 months (around 01/21/2019), or if symptoms worsen or fail to improve, for Depression recheck.  Counseling provided for all of the vaccine components Orders Placed This Encounter  Procedures  . CBC with Differential/Platelet  . CMP14+EGFR  . Lipid panel    Caryl Pina, MD [facility] 07/21/2018, 1:59 PM

## 2018-07-22 LAB — CMP14+EGFR
A/G RATIO: 1.5 (ref 1.2–2.2)
ALBUMIN: 4.3 g/dL (ref 3.5–5.5)
ALT: 11 IU/L (ref 0–32)
AST: 19 IU/L (ref 0–40)
Alkaline Phosphatase: 68 IU/L (ref 39–117)
BUN / CREAT RATIO: 15 (ref 9–23)
BUN: 13 mg/dL (ref 6–24)
Bilirubin Total: 0.5 mg/dL (ref 0.0–1.2)
CO2: 21 mmol/L (ref 20–29)
Calcium: 9.5 mg/dL (ref 8.7–10.2)
Chloride: 102 mmol/L (ref 96–106)
Creatinine, Ser: 0.89 mg/dL (ref 0.57–1.00)
GFR, EST AFRICAN AMERICAN: 94 mL/min/{1.73_m2} (ref >59)
GFR, EST NON AFRICAN AMERICAN: 81 mL/min/{1.73_m2} (ref >59)
GLOBULIN, TOTAL: 2.8 g/dL (ref 1.5–4.5)
Glucose: 84 mg/dL (ref 65–99)
Potassium: 4.9 mmol/L (ref 3.5–5.2)
SODIUM: 138 mmol/L (ref 134–144)
TOTAL PROTEIN: 7.1 g/dL (ref 6.0–8.5)

## 2018-07-22 LAB — CBC WITH DIFFERENTIAL/PLATELET
Basophils Absolute: 0 10*3/uL (ref 0.0–0.2)
Basos: 1 %
EOS (ABSOLUTE): 0.1 10*3/uL (ref 0.0–0.4)
EOS: 1 %
HEMATOCRIT: 42.2 % (ref 34.0–46.6)
HEMOGLOBIN: 13.4 g/dL (ref 11.1–15.9)
Immature Grans (Abs): 0 10*3/uL (ref 0.0–0.1)
Immature Granulocytes: 0 %
Lymphocytes Absolute: 2.5 10*3/uL (ref 0.7–3.1)
Lymphs: 30 %
MCH: 28.5 pg (ref 26.6–33.0)
MCHC: 31.8 g/dL (ref 31.5–35.7)
MCV: 90 fL (ref 79–97)
Monocytes Absolute: 0.5 10*3/uL (ref 0.1–0.9)
Monocytes: 6 %
NEUTROS ABS: 5.1 10*3/uL (ref 1.4–7.0)
Neutrophils: 62 %
Platelets: 277 10*3/uL (ref 150–450)
RBC: 4.71 x10E6/uL (ref 3.77–5.28)
RDW: 13.9 % (ref 12.3–15.4)
WBC: 8.2 10*3/uL (ref 3.4–10.8)

## 2018-07-22 LAB — LIPID PANEL
CHOL/HDL RATIO: 2.8 ratio (ref 0.0–4.4)
Cholesterol, Total: 186 mg/dL (ref 100–199)
HDL: 67 mg/dL (ref >39)
LDL Calculated: 100 mg/dL — ABNORMAL HIGH (ref 0–99)
Triglycerides: 93 mg/dL (ref 0–149)
VLDL Cholesterol Cal: 19 mg/dL (ref 5–40)

## 2018-08-03 ENCOUNTER — Other Ambulatory Visit: Payer: Self-pay | Admitting: Family Medicine

## 2018-08-03 DIAGNOSIS — F32A Depression, unspecified: Secondary | ICD-10-CM

## 2018-08-03 DIAGNOSIS — F419 Anxiety disorder, unspecified: Principal | ICD-10-CM

## 2018-08-03 DIAGNOSIS — F329 Major depressive disorder, single episode, unspecified: Secondary | ICD-10-CM

## 2018-09-21 DIAGNOSIS — F419 Anxiety disorder, unspecified: Secondary | ICD-10-CM | POA: Insufficient documentation

## 2018-09-21 DIAGNOSIS — F988 Other specified behavioral and emotional disorders with onset usually occurring in childhood and adolescence: Secondary | ICD-10-CM | POA: Insufficient documentation

## 2018-11-12 ENCOUNTER — Encounter: Payer: Self-pay | Admitting: Family Medicine

## 2018-11-12 ENCOUNTER — Ambulatory Visit: Payer: BC Managed Care – PPO | Admitting: Family Medicine

## 2018-11-12 VITALS — BP 105/71 | HR 84 | Temp 98.2°F | Ht 66.0 in | Wt 163.4 lb

## 2018-11-12 DIAGNOSIS — R238 Other skin changes: Secondary | ICD-10-CM | POA: Diagnosis not present

## 2018-11-12 DIAGNOSIS — R5383 Other fatigue: Secondary | ICD-10-CM

## 2018-11-12 DIAGNOSIS — R233 Spontaneous ecchymoses: Secondary | ICD-10-CM

## 2018-11-12 DIAGNOSIS — E663 Overweight: Secondary | ICD-10-CM | POA: Diagnosis not present

## 2018-11-12 DIAGNOSIS — W57XXXA Bitten or stung by nonvenomous insect and other nonvenomous arthropods, initial encounter: Secondary | ICD-10-CM

## 2018-11-12 DIAGNOSIS — R5381 Other malaise: Secondary | ICD-10-CM | POA: Diagnosis not present

## 2018-11-12 DIAGNOSIS — F339 Major depressive disorder, recurrent, unspecified: Secondary | ICD-10-CM | POA: Diagnosis not present

## 2018-11-12 MED ORDER — DOXYCYCLINE HYCLATE 100 MG PO TABS: 100.0000 mg | ORAL_TABLET | Freq: Two times a day (BID) | ORAL | 0 refills | Status: DC

## 2018-11-12 NOTE — Patient Instructions (Signed)
Ehrlichiosis and Anaplasmosis Ehrlichiosis and anaplasmosis are diseases that people get from ticks. Ehrlichiosis is also called human monocytic ehrlichiosis (HME). Anaplasmosis is also called human granulocytic anaplasmosis (HGA). Symptoms often show up 1-2 weeks after a tick bite. Symptoms include:  Fever.  Chills.  Headache.  Muscle aches.  Tiredness.  Feeling sick to your stomach (having nausea).  Throwing up (vomiting).  Watery poop (diarrhea).  Cough.  Confusion.  Red eyes.  Rash. This is rare.  You also may not have any symptoms. How is this diagnosed? Diagnosis is based on a physical exam, medical history, and blood tests. Your doctor may suspect these diseases if:  You have recently been bitten by a tick.  You have been in areas that have a lot of ticks or in areas where the disease is common.  You have family members, pets, or coworkers who have recently been diagnosed with the disease.  Let your doctor know if you have been diagnosed before with HME, HGE, or another disease that you can get from ticks. How is this treated? If your doctor thinks that you may have HME or HGA, treatment will start immediately. This may happen before your doctor has confirmed your diagnosis. Depending on your symptoms, treatment may include:  Antibiotic medicine for 1-2 weeks.  Hospitalization in the worst cases.  Let your doctor know if you are pregnant or breastfeeding before starting treatment. Follow these instructions at home:  Take over-the-counter and prescription medicines only as told by your doctor.  Take your antibiotic medicine as told by your doctor. Do not stop taking the antibiotic even if you start to feel better.  Drink enough fluid to keep your pee (urine) clear or pale yellow.  Rest at home while you get better. Return to your normal activities as told by your doctor.  Keep all follow-up visits as told by your doctor. This is important. How is this  prevented? Avoiding tick bites can help to prevent these diseases. Be aware that most ticks live in shrubs, low tree branches, and grassy areas. A tick can climb onto your body when you make contact with leaves or grass where the tick is waiting. Do these things to avoid tick bites when you are outdoors:  Wear protective clothing. Long sleeves and long pants are best.  Wear white clothes so you can see ticks more easily.  Tuck your pant legs into your socks.  Put insect repellent on all exposed skin and on your clothes.  Use tick prevention products on your pets, such as shampoos or tick collars.  If you go walking on a trail, stay in the middle of the trail to avoid brushing against bushes.  Avoid walking through areas that have long grass.  Check clothing, hair, and skin repeatedly and before going inside.  Check family members and pets for ticks.  Brush off any ticks that are not attached.  Take a shower or a bath as soon as possible after you have been outdoors. Check your skin for ticks. The most common places on the body where ticks attach themselves are the scalp, neck, armpits, waist, and groin.  Remove attached ticks from your skin, your family members, and pets immediately. Use fine-point tweezers to grasp the tick as close to the skin as possible. Pull back and up with constant pressure. Check the skin to make sure the tick's mouth parts were not left attached. Wash the skin with soap and water after the tick has been removed.  Do  not use gasoline, petroleum jelly, nail polish remover, or lit matches to remove ticks.  Do not crush removed ticks with your fingers.  Contact a doctor if:  You have a fever.  You have a headache.  You are tired.  You have muscle pain.  You feel sick to your stomach.  You throw up.  You have watery poop.  You still have a headache and weakness after your other symptoms have gone away. Get help right away if:  You have bleeding  that does not stop (is persistent).  You have a seizure.  You have trouble breathing. This information is not intended to replace advice given to you by your health care provider. Make sure you discuss any questions you have with your health care provider. Document Released: 09/28/2009 Document Revised: 05/08/2016 Document Reviewed: 07/04/2015 Elsevier Interactive Patient Education  2018 [employer]

## 2018-11-12 NOTE — Progress Notes (Signed)
BP 105/71   Pulse 84   Temp 98.2 F (36.8 C)   Ht 5\' 6"  (1.676 m)   Wt 163 lb 6.4 oz (74.1 kg)   BMI 26.37 kg/m    Subjective:    Patient ID: Alexa Leon, female    DOB: May 17, 1976, 42 y.o.   MRN: [id number]  HPI: Alexa Leon is a 42 y.o. female presenting on 11/12/2018 for Follow-up (tick bite)   HPI Tick bite and fatigue and bruising Patient had a similar episode to this last year where she had a tick bite a couple weeks ago and then she is now developing this bruising on her upper inner thighs that is unexplained over the past couple weeks.  She denies any fevers or chills.  Last time she was treated with doxycycline and it resolved and none of her blood work came back abnormal except and IgG for [facility] spotted fever.  She denies any fevers or chills or rash anywhere else except for the bruising on her upper inner thighs.  Recheck of depression Patient is coming in for recheck of her depression and she is currently taking Wellbutrin and Lexapro and says that they are working well and she is very happy with life and really feeling very well except for the fatigue this been related since she had this last tick bite and the bruising has developed. Depression screen [facility] 2/9 11/12/2018 07/21/2018 10/22/2017 09/28/2017 09/28/2017  Decreased Interest 0 0 1 1 0  Down, Depressed, Hopeless 1 1 1 1  0  PHQ - 2 Score 1 1 2 2  0  Altered sleeping - - 0 2 -  Tired, decreased energy - - 1 2 -  Change in appetite - - 0 0 -  Feeling bad or failure about yourself  - - 0 0 -  Trouble concentrating - - 1 1 -  Moving slowly or fidgety/restless - - 0 0 -  Suicidal thoughts - - 0 0 -  PHQ-9 Score - - 4 7 -  Difficult doing work/chores - - - Not difficult at all -     Relevant past medical, surgical, family and social history reviewed and updated as indicated. Interim medical history since our last visit reviewed. Allergies and medications reviewed and updated.  Review of Systems    Constitutional: Negative for chills and fever.  Eyes: Negative for visual disturbance.  Respiratory: Negative for chest tightness and shortness of breath.   Cardiovascular: Negative for chest pain and leg swelling.  Gastrointestinal: Negative for abdominal pain, constipation, diarrhea, nausea and vomiting.  Genitourinary: Negative for decreased urine volume, dysuria, frequency, hematuria and urgency.  Musculoskeletal: Negative for back pain and gait problem.  Skin: Positive for color change. Negative for rash.  Neurological: Negative for light-headedness and headaches.  Psychiatric/Behavioral: Negative for agitation, behavioral problems, dysphoric mood, self-injury, sleep disturbance and suicidal ideas. The patient is not nervous/anxious.   All other systems reviewed and are negative.   Per HPI unless specifically indicated above   Allergies as of 11/12/2018      Reactions   Amoxicillin Swelling   Swelling lips, blisters in the throat      Medication List        Accurate as of 11/12/18  3:07 PM. Always use your most recent med list.          buPROPion 200 MG 12 hr tablet Commonly known as:  WELLBUTRIN SR Take 1 tablet (200 mg total) by mouth daily.   doxycycline 100 MG  tablet Commonly known as:  VIBRA-TABS Take 1 tablet (100 mg total) by mouth 2 (two) times daily. 1 po bid   escitalopram 20 MG tablet Commonly known as:  LEXAPRO Take 1 tablet (20 mg total) by mouth daily.   hydrOXYzine 25 MG tablet Commonly known as:  ATARAX/VISTARIL Take 1 tablet (25 mg total) by mouth 3 (three) times daily as needed.          Objective:    BP 105/71   Pulse 84   Temp 98.2 F (36.8 C)   Ht 5\' 6"  (1.676 m)   Wt 163 lb 6.4 oz (74.1 kg)   BMI 26.37 kg/m   Wt Readings from Last 3 Encounters:  11/12/18 163 lb 6.4 oz (74.1 kg)  07/21/18 163 lb 9.6 oz (74.2 kg)  10/22/17 174 lb (78.9 kg)    Physical Exam  Constitutional: She is oriented to person, place, and time. She  appears well-developed and well-nourished. No distress.  Eyes: Conjunctivae are normal.  Neck: Neck supple. No thyromegaly present.  Cardiovascular: Normal rate, regular rhythm, normal heart sounds and intact distal pulses.  No murmur heard. Pulmonary/Chest: Effort normal and breath sounds normal. No respiratory distress. She has no wheezes.  Lymphadenopathy:    She has no cervical adenopathy.  Neurological: She is alert and oriented to person, place, and time. Coordination normal.  Skin: Skin is warm and dry. Bruising (Bruising on upper thighs and inner thighs) noted. No rash noted. She is not diaphoretic.  Psychiatric: She has a normal mood and affect. Her behavior is normal.  Nursing note and vitals reviewed.       Assessment & Plan:   Problem List Items Addressed This Visit      Other   Depression, recurrent (HCC)   Overweight (BMI 25.0-29.9)    Other Visit Diagnoses    Abnormal bruising    -  Primary   Relevant Medications   doxycycline (VIBRA-TABS) 100 MG tablet   Other Relevant Orders   CBC with Differential/Platelet (Completed)   Rocky mtn spotted fvr abs pnl(IgG+IgM) (Completed)   Sedimentation rate (Completed)   Alpha-Gal Panel (Completed)   Lyme Ab/Western Blot Reflex (Completed)   Ehrlichia Antibody Panel (Completed)   Malaise and fatigue       Relevant Medications   doxycycline (VIBRA-TABS) 100 MG tablet   Other Relevant Orders   CBC with Differential/Platelet (Completed)   Rocky mtn spotted fvr abs pnl(IgG+IgM) (Completed)   Sedimentation rate (Completed)   Alpha-Gal Panel (Completed)   Lyme Ab/Western Blot Reflex (Completed)   Ehrlichia Antibody Panel (Completed)   Tick bite, initial encounter       Left upper middle back   Relevant Medications   doxycycline (VIBRA-TABS) 100 MG tablet   Other Relevant Orders   CBC with Differential/Platelet (Completed)   Rocky mtn spotted fvr abs pnl(IgG+IgM) (Completed)   Sedimentation rate (Completed)   Alpha-Gal  Panel (Completed)   Lyme Ab/Western Blot Reflex (Completed)   Ehrlichia Antibody Panel (Completed)       Follow up plan: Return in about 6 months (around 05/13/2019), or if symptoms worsen or fail to improve, for Recheck depression, well woman exam and physical, no Pap.  Counseling provided for all of the vaccine components Orders Placed This Encounter  Procedures  . CBC with Differential/Platelet  . Rocky mtn spotted fvr abs pnl(IgG+IgM)  . Sedimentation rate  . Alpha-Gal Panel  . Lyme Ab/Western Blot Reflex  . Ehrlichia Antibody Panel    Arville Care, MD  [facility] 11/12/2018, 3:07 PM

## 2018-11-17 LAB — CBC WITH DIFFERENTIAL/PLATELET
Basophils Absolute: 0.1 10*3/uL (ref 0.0–0.2)
Basos: 1 %
EOS (ABSOLUTE): 0.3 10*3/uL (ref 0.0–0.4)
EOS: 4 %
HEMATOCRIT: 42.1 % (ref 34.0–46.6)
Hemoglobin: 13.7 g/dL (ref 11.1–15.9)
Immature Grans (Abs): 0 10*3/uL (ref 0.0–0.1)
Immature Granulocytes: 0 %
Lymphocytes Absolute: 2.3 10*3/uL (ref 0.7–3.1)
Lymphs: 30 %
MCH: 28.5 pg (ref 26.6–33.0)
MCHC: 32.5 g/dL (ref 31.5–35.7)
MCV: 88 fL (ref 79–97)
Monocytes Absolute: 0.5 10*3/uL (ref 0.1–0.9)
Monocytes: 6 %
Neutrophils Absolute: 4.6 10*3/uL (ref 1.4–7.0)
Neutrophils: 59 %
Platelets: 330 10*3/uL (ref 150–450)
RBC: 4.81 x10E6/uL (ref 3.77–5.28)
RDW: 12.4 % (ref 12.3–15.4)
WBC: 7.9 10*3/uL (ref 3.4–10.8)

## 2018-11-17 LAB — ALPHA-GAL PANEL
Alpha Gal IgE*: 5.8 kU/L — ABNORMAL HIGH (ref <0.10)
Beef (Bos spp) IgE: 0.53 kU/L — ABNORMAL HIGH (ref <0.35)
Class Interpretation: 0
Class Interpretation: 1
PORK CLASS INTERPRETATION: 0
Pork (Sus spp) IgE: <0.1 kU/L (ref <0.35)

## 2018-11-17 LAB — EHRLICHIA ANTIBODY PANEL
E. Chaffeensis (HME) IgM Titer: NEGATIVE
E.Chaffeensis (HME) IgG: NEGATIVE
HGE IgG Titer: NEGATIVE
HGE IgM Titer: NEGATIVE

## 2018-11-17 LAB — LYME AB/WESTERN BLOT REFLEX
LYME DISEASE AB, QUANT, IGM: <0.8 index (ref 0.00–0.79)
Lyme IgG/IgM Ab: <0.91 {ISR} (ref 0.00–0.90)

## 2018-11-17 LAB — ROCKY MTN SPOTTED FVR ABS PNL(IGG+IGM)
RMSF IgG: NEGATIVE
RMSF IgM: 0.9 index — ABNORMAL HIGH (ref 0.00–0.89)

## 2018-11-17 LAB — SEDIMENTATION RATE: Sed Rate: 3 mm/hr (ref 0–32)

## 2018-11-19 ENCOUNTER — Encounter: Payer: Self-pay | Admitting: Family Medicine

## 2019-02-10 ENCOUNTER — Ambulatory Visit: Payer: BC Managed Care – PPO | Admitting: Family Medicine

## 2019-02-10 ENCOUNTER — Encounter: Payer: Self-pay | Admitting: Family Medicine

## 2019-02-10 VITALS — BP 89/65 | HR 87 | Temp 97.5°F | Ht 66.0 in | Wt 162.2 lb

## 2019-02-10 DIAGNOSIS — J069 Acute upper respiratory infection, unspecified: Secondary | ICD-10-CM

## 2019-02-10 LAB — VERITOR FLU A/B WAIVED
Influenza A: NEGATIVE
Influenza B: NEGATIVE

## 2019-02-10 MED ORDER — FLUTICASONE PROPIONATE 50 MCG/ACT NA SUSP: 1.0000 | Freq: Two times a day (BID) | NASAL | 6 refills | Status: DC | PRN

## 2019-02-10 NOTE — Progress Notes (Signed)
BP (!) 89/65   Pulse 87   Temp (!) 97.5 F (36.4 C) (Oral)   Ht 5\' 6"  (1.676 m)   Wt 73.6 kg   SpO2 100%   BMI 26.18 kg/m    Subjective:    Patient ID: Alexa Leon, female    DOB: 23-Sep-1976, 43 y.o.   MRN: [id number]  HPI: Alexa Leon is a 43 y.o. female presenting on 02/10/2019 for Nasal Congestion (x 3 days); Sore Throat; Ear Pain (bilateral); and Generalized Body Aches   HPI Pt is a 43 y/o female presenting for 4 days of fatigue, sore throat, and body aches.  She states her symptoms began late Sunday night, and have progressively worsened. She also complains of mild nasal congestion, eye drainage, and aching pain in her ears bilaterally.  She denies fevers, but says she has felt a bit warm intermittently. She reports some dizziness, which is worse when standing. She denies cough, shortness of breath, and nausea, vomiting, or diarrhea. 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    Relevant past medical, surgical, family and social history reviewed and updated as indicated. Interim medical history since our last visit reviewed. Allergies and medications reviewed and updated.  Review of Systems  Constitutional: Positive for appetite change (decreased) and fatigue. Negative for fever.  HENT: Positive for congestion, ear pain, rhinorrhea and sore throat.   Eyes: Positive for discharge and redness. Negative for itching.  Respiratory: Negative for cough and shortness of breath.   Cardiovascular: Negative for chest pain.  Gastrointestinal: Negative for abdominal pain, blood in stool, diarrhea, nausea and vomiting.  Genitourinary: Negative for hematuria.  Musculoskeletal: Positive for myalgias.  Neurological: Positive for dizziness. Negative for syncope.    Per HPI unless specifically indicated above      Objective:    BP (!) 89/65   Pulse 87   Temp (!) 97.5 F (36.4 C) (Oral)   Ht 5\' 6"  (1.676 m)   Wt 73.6 kg   SpO2 100%   BMI 26.18 kg/m     Wt Readings from Last 3 Encounters:  02/10/19 73.6 kg  11/12/18 74.1 kg  07/21/18 74.2 kg    Physical Exam Constitutional:      General: She is not in acute distress.    Appearance: She is well-developed.  HENT:     Right Ear: Ear canal normal. No drainage. A middle ear effusion is present. Tympanic membrane is not erythematous.     Left Ear: Ear canal normal. No drainage. A middle ear effusion is present. Tympanic membrane is not erythematous.     Mouth/Throat:     Pharynx: Posterior oropharyngeal erythema (mild) present. No oropharyngeal exudate.  Cardiovascular:     Rate and Rhythm: Normal rate and regular rhythm.     Heart sounds: Normal heart sounds.  Pulmonary:     Breath sounds: Normal breath sounds. No wheezing.  Lymphadenopathy:     Cervical: Cervical adenopathy (mild, with tenderness to palpation) present.  Neurological:     Mental Status: She is alert.   Rapid flu negative    Assessment & Plan:   Problem List Items Addressed This Visit    None    Visit Diagnoses    Viral upper respiratory infection    -  Primary   Relevant Medications   fluticasone (FLONASE) 50 MCG/ACT nasal spray   Other Relevant Orders   Veritor Flu A/B Waived    Recommended Flonase, Mucinex, saline nasal sprays, oral hydration  and rest.   Follow up plan: Return if symptoms worsen or fail to improve.  Counseling provided for all of the vaccine components Orders Placed This Encounter  Procedures  . Veritor Flu A/B Waived    Jodelle Gross PA-S [facility]  I was personally present for all components of the history, physical exam and/or medical decision making.  I agree with the documentation performed by the PA student and agree with assessment and plan above.  PA student was Jodelle Gross. Arville Care, MD [facility] 02/15/2019, 9:17 PM

## 2019-06-24 ENCOUNTER — Encounter: Payer: Self-pay | Admitting: Family Medicine

## 2019-06-24 ENCOUNTER — Ambulatory Visit (INDEPENDENT_AMBULATORY_CARE_PROVIDER_SITE_OTHER): Payer: BC Managed Care – PPO | Admitting: Family Medicine

## 2019-06-24 DIAGNOSIS — N946 Dysmenorrhea, unspecified: Secondary | ICD-10-CM | POA: Diagnosis not present

## 2019-06-24 MED ORDER — MEFENAMIC ACID 250 MG PO CAPS: 500.0000 mg | ORAL_CAPSULE | Freq: Three times a day (TID) | ORAL | 0 refills | Status: DC

## 2019-06-24 NOTE — Patient Instructions (Signed)
Dysmenorrhea  Dysmenorrhea refers to cramps caused by the muscles of the uterus tightening (contracting) during a menstrual period. Dysmenorrhea may be mild, or it may be severe enough to interfere with everyday activities for a few days each month. Primary dysmenorrhea is menstrual cramps that last a couple of days when you start having menstrual periods or soon after. This often begins after a teenager starts having her period. As a woman gets older or has a baby, the cramps will usually lessen or disappear. Secondary dysmenorrhea begins later in life and is caused by a disorder in the reproductive system. It lasts longer, and it may cause more pain than primary dysmenorrhea. The pain may start before the period and last a few days after the period. What are the causes? Dysmenorrhea is usually caused by an underlying problem, such as:  The tissue that lines the uterus (endometrium) growing outside of the uterus in other areas of the body (endometriosis).  Endometrial tissue growing into the muscular walls of the uterus (adenomyosis).  Blood vessels in the pelvis becoming filled with blood just before the menstrual period (pelvic congestive syndrome).  Overgrowth of cells (polyps) in the endometrium or the lower part of the uterus (cervix).  The uterus dropping down into the vagina (prolapse) due to stretched or weak muscles.  Bladder problems, such as infection or inflammation.  Intestinal problems, such as a tumor or irritable bowel syndrome.  Cancer of the reproductive organs or bladder.  A severely tipped uterus.  A cervix that is closed or has a very small opening.  Noncancerous (benign) tumors of the uterus (fibroids).  Pelvic inflammatory disease (PID).  Pelvic scarring (adhesions) from a previous surgery.  An ovarian cyst.  An IUD (intrauterine device). What increases the risk? You are more likely to develop this condition if:  You are younger than age 30.  You  started puberty early.  You have irregular or heavy bleeding.  You have never given birth.  You have a family history of dysmenorrhea.  You smoke. What are the signs or symptoms? Symptoms of this condition include:  Cramping, throbbing pain, or a feeling of fullness in the lower abdomen.  Lower back pain.  Periods lasting for longer than 7 days.  Headaches.  Bloating.  Fatigue.  Nausea or vomiting.  Diarrhea.  Sweating or dizziness.  Loose stools. How is this diagnosed? This condition may be diagnosed based on:  Your symptoms.  Your medical history.  A physical exam.  Blood tests.  A Pap test. This is a test in which cells from the cervix are tested for signs of cancer or infection.  A pregnancy test.  Imaging tests, such as: ? Ultrasound. ? A procedure to remove and examine a sample of endometrial tissue (dilation and curettage, D&C). ? A procedure to visually examine the inside of:  The uterus (hysteroscopy).  The abdomen or pelvis (laparoscopy).  The bladder (cystoscopy).  The intestine (colonoscopy).  The stomach (gastroscopy). ? X-rays. ? CT scan. ? MRI. How is this treated? Treatment depends on the cause of the dysmenorrhea. Treatment may include:  Pain medicine prescribed by your health care provider.  Birth control pills that contain the hormone progesterone.  An IUD that contains the hormone progesterone.  Medicines to control bleeding.  Hormone replacement therapy.  NSAIDs. These may help to stop the production of hormones that cause cramps.  Antidepressant medicines.  Surgery to remove adhesions, endometriosis, ovarian cysts, fibroids, or the entire uterus (hysterectomy).  Injections of progesterone   to stop the menstrual period.  A procedure to destroy the endometrium (endometrial ablation).  A procedure to cut the nerves in the bottom of the spine (sacrum) that go to the reproductive organs (presacral neurectomy).  A  procedure to apply an electric current to nerves in the sacrum (sacral nerve stimulation).  Exercise and physical therapy.  Meditation and yoga therapy.  Acupuncture. Work with your health care provider to determine what treatment or combination of treatments is best for you. Follow these instructions at home: Relieving pain and cramping  Apply heat to your lower back or abdomen when you experience pain or cramps. Use the heat source that your health care provider recommends, such as a moist heat pack or a heating pad. ? Place a towel between your skin and the heat source. ? Leave the heat on for 20-30 minutes. ? Remove the heat if your skin turns bright red. This is especially important if you are unable to feel pain, heat, or cold. You may have a greater risk of getting burned. ? Do not sleep with a heating pad on.  Do aerobic exercises, such as walking, swimming, or biking. This can help to relieve cramps.  Massage your lower back or abdomen to help relieve pain. General instructions  Take over-the-counter and prescription medicines only as told by your health care provider.  Do not drive or use heavy machinery while taking prescription pain medicine.  Avoid alcohol and caffeine during and right before your menstrual period. These can make cramps worse.  Do not use any products that contain nicotine or tobacco, such as cigarettes and e-cigarettes. If you need help quitting, ask your health care provider.  Keep all follow-up visits as told by your health care provider. This is important. Contact a health care provider if:  You have pain that gets worse or does not get better with medicine.  You have pain with sex.  You develop nausea or vomiting with your period that is not controlled with medicine. Get help right away if:  You faint. Summary  Dysmenorrhea refers to cramps caused by the muscles of the uterus tightening (contracting) during a menstrual period.   Dysmenorrhea may be mild, or it may be severe enough to interfere with everyday activities for a few days each month.  Treatment depends on the cause of the dysmenorrhea.  Work with your health care provider to determine what treatment or combination of treatments is best for you. This information is not intended to replace advice given to you by your health care provider. Make sure you discuss any questions you have with your health care provider. Document Released: 12/01/2005 Document Revised: 11/13/2017 Document Reviewed: 01/03/2017 Elsevier Patient Education  2020 [employer]  

## 2019-06-24 NOTE — Progress Notes (Signed)
Virtual Visit via Telephone Note  I connected with Alexa HedgesBonnie Lombard on 06/24/19 at 3:32 PM by telephone and verified that I am speaking with the correct person using two identifiers. Alexa Leon is currently located at home and nobody is currently with her during this visit. The provider, Gwenlyn FudgeBRITNEY F JOYCE, FNP is located in their office at time of visit.  I discussed the limitations, risks, security and privacy concerns of performing an evaluation and management service by telephone and the availability of in person appointments. I also discussed with the patient that there may be a patient responsible charge related to this service. The patient expressed understanding and agreed to proceed.  Subjective: PCP: Dettinger, Elige RadonJoshua A, MD  No chief complaint on file.  Dysmenorrhea/ Premenstrual Syndrome: Patient complains of menstrual symptoms. Symptoms began 3 years ago. Patient describes symptoms of  menorrhagia (severe), menstrual cramping (severe) and pelvic pain (severe). She states the pain goes across her uterus and down her leg. She describes the cramps as excruciating, radiating, and like a muscular grip. Symptoms occur with periods, which are quite regular.  Evaluation to date includes a normal pap smear last year. Treatment to date includes OTC NSAIDs (ineffective). She has tried Ibuprofen 600 mg, Tylenol, Naproxen 500 mg, and ASA; she reports they are not touching the pain. She has also been applying a heating pad several times a day. Patient took birth control from a young age until 6 years ago when she went off of it to conceive her son. She feels these symptoms have been occurring since then but states they are getting worse. She reports she cannot remember a pain this bad since high school when she would have to miss school because she was in so much pain, and even then it wasn't as bad as it is right now.   ROS: Per HPI  Allergies  Allergen Reactions  . Amoxicillin Swelling   Swelling lips, blisters in the throat   Past Medical History:  Diagnosis Date  . Anxiety   . Depression    PP    Current Outpatient Medications:  .  buPROPion (WELLBUTRIN SR) 200 MG 12 hr tablet, Take 1 tablet (200 mg total) by mouth daily., Disp: 90 tablet, Rfl: 3 .  escitalopram (LEXAPRO) 20 MG tablet, Take 1 tablet (20 mg total) by mouth daily., Disp: 90 tablet, Rfl: 3 .  fluticasone (FLONASE) 50 MCG/ACT nasal spray, Place 1 spray into both nostrils 2 (two) times daily as needed for allergies or rhinitis., Disp: 16 g, Rfl: 6 .  hydrOXYzine (ATARAX/VISTARIL) 25 MG tablet, Take 1 tablet (25 mg total) by mouth 3 (three) times daily as needed., Disp: 90 tablet, Rfl: 3   Observations/Objective: A&O  No respiratory distress or wheezing audible over the phone Mood, judgement, and thought processes all WNL  Assessment and Plan: 1. Severe dysmenorrhea - Education provided on dysmenorrhea. Encouraged to continue use of heating bad throughout the day. She would like to discuss next steps with her PCP but asked that I go ahead and order the ultrasound.  - Mefenamic Acid 250 MG CAPS; Take 2 capsules (500 mg total) by mouth 3 (three) times daily. (during menstrual cycle)  Dispense: 28 capsule; Refill: 0 - US Pelvic Complete With Transvaginal; Future   Follow Up Instructions: Patient is going to f/u with PCP after ultrasound results are received to discuss next steps.   I discussed the assessment and treatment plan with the patient. The patient was provided an opportunity to ask  questions and all were answered. The patient agreed with the plan and demonstrated an understanding of the instructions.   The patient was advised to call back or seek an in-person evaluation if the symptoms worsen or if the condition fails to improve as anticipated.  The above assessment and management plan was discussed with the patient. The patient verbalized understanding of and has agreed to the management plan.  Patient is aware to call the clinic if symptoms persist or worsen. Patient is aware when to return to the clinic for a follow-up visit. Patient educated on when it is appropriate to go to the emergency department.   Time call ended: 3:45 PM  I provided 13 minutes of non-face-to-face time during this encounter.  Hendricks Limes, MSN, APRN, FNP-C [facility] 06/24/19

## 2019-07-01 ENCOUNTER — Ambulatory Visit (HOSPITAL_COMMUNITY)
Admission: RE | Admit: 2019-07-01 | Discharge: 2019-07-01 | Disposition: A | Discharge status: Home / Self Care | Payer: BC Managed Care – PPO | Source: Ambulatory Visit | Attending: Family Medicine | Admitting: Family Medicine

## 2019-07-01 ENCOUNTER — Other Ambulatory Visit: Payer: Self-pay

## 2019-07-01 DIAGNOSIS — N946 Dysmenorrhea, unspecified: Secondary | ICD-10-CM | POA: Diagnosis not present

## 2019-08-21 ENCOUNTER — Other Ambulatory Visit: Payer: Self-pay | Admitting: Family Medicine

## 2019-08-21 DIAGNOSIS — F329 Major depressive disorder, single episode, unspecified: Secondary | ICD-10-CM

## 2019-08-21 DIAGNOSIS — F32A Depression, unspecified: Secondary | ICD-10-CM

## 2019-09-23 ENCOUNTER — Other Ambulatory Visit: Payer: Self-pay

## 2019-09-23 DIAGNOSIS — Z20822 Contact with and (suspected) exposure to covid-19: Secondary | ICD-10-CM

## 2019-09-25 LAB — NOVEL CORONAVIRUS, NAA: SARS-CoV-2, NAA: NOT DETECTED

## 2019-09-27 ENCOUNTER — Other Ambulatory Visit: Payer: Self-pay | Admitting: *Deleted

## 2019-09-27 DIAGNOSIS — Z20822 Contact with and (suspected) exposure to covid-19: Secondary | ICD-10-CM

## 2019-09-29 LAB — NOVEL CORONAVIRUS, NAA: SARS-CoV-2, NAA: NOT DETECTED

## 2019-10-13 ENCOUNTER — Other Ambulatory Visit: Payer: Self-pay | Admitting: Family Medicine

## 2019-10-13 DIAGNOSIS — F419 Anxiety disorder, unspecified: Secondary | ICD-10-CM

## 2019-10-13 DIAGNOSIS — F329 Major depressive disorder, single episode, unspecified: Secondary | ICD-10-CM

## 2019-10-13 NOTE — Telephone Encounter (Signed)
Left detailed message - needs apt to get refill.

## 2019-10-13 NOTE — Telephone Encounter (Signed)
Dettinger. NTBS 30 days given 08/23/19

## 2020-02-19 ENCOUNTER — Ambulatory Visit: Payer: BC Managed Care – PPO | Attending: Internal Medicine

## 2020-02-19 DIAGNOSIS — Z23 Encounter for immunization: Secondary | ICD-10-CM | POA: Insufficient documentation

## 2020-02-19 NOTE — Progress Notes (Signed)
   Covid-19 Vaccination Clinic  Name:  Anise Harbin    MRN: [id number] DOB: [birth year]  02/19/2020  Ms. Revolorio was observed post Covid-19 immunization for 15 minutes without incident. She was provided with Vaccine Information Sheet and instruction to access the V-Safe system.   Ms. Mireles was instructed to call 911 with any severe reactions post vaccine: Marland Kitchen Difficulty breathing  . Swelling of face and throat  . A fast heartbeat  . A bad rash all over body  . Dizziness and weakness   Immunizations Administered    Name Date Dose VIS Date Route   Pfizer COVID-19 Vaccine 02/19/2020  9:31 AM 0.3 mL 11/25/2019 Intramuscular   Manufacturer: [employer], [employer]   Lot: [id number]   NDC: [address]-1

## 2020-03-02 ENCOUNTER — Other Ambulatory Visit: Payer: Self-pay | Admitting: Family Medicine

## 2020-03-02 DIAGNOSIS — F329 Major depressive disorder, single episode, unspecified: Secondary | ICD-10-CM

## 2020-03-02 DIAGNOSIS — F32A Depression, unspecified: Secondary | ICD-10-CM

## 2020-03-02 DIAGNOSIS — N946 Dysmenorrhea, unspecified: Secondary | ICD-10-CM

## 2020-03-02 MED ORDER — ESCITALOPRAM OXALATE 20 MG PO TABS: 20.0000 mg | ORAL_TABLET | Freq: Every day | ORAL | 0 refills | Status: DC

## 2020-03-02 MED ORDER — MEFENAMIC ACID 250 MG PO CAPS: 500.0000 mg | ORAL_CAPSULE | Freq: Three times a day (TID) | ORAL | 0 refills | Status: DC

## 2020-03-03 ENCOUNTER — Other Ambulatory Visit: Payer: Self-pay | Admitting: Family Medicine

## 2020-03-03 DIAGNOSIS — N946 Dysmenorrhea, unspecified: Secondary | ICD-10-CM

## 2020-03-04 ENCOUNTER — Other Ambulatory Visit: Payer: Self-pay | Admitting: Family Medicine

## 2020-03-04 DIAGNOSIS — N946 Dysmenorrhea, unspecified: Secondary | ICD-10-CM

## 2020-03-07 ENCOUNTER — Other Ambulatory Visit: Payer: Self-pay | Admitting: Family Medicine

## 2020-03-07 DIAGNOSIS — N946 Dysmenorrhea, unspecified: Secondary | ICD-10-CM

## 2020-03-11 ENCOUNTER — Ambulatory Visit: Payer: Self-pay | Attending: Internal Medicine

## 2020-03-11 DIAGNOSIS — Z23 Encounter for immunization: Secondary | ICD-10-CM

## 2020-03-11 NOTE — Progress Notes (Signed)
   Covid-19 Vaccination Clinic  Name:  Alexa Leon    MRN: [id number] DOB: 04-10-1976  03/11/2020  Alexa Leon was observed post Covid-19 immunization for 15 minutes without incident. She was provided with Vaccine Information Sheet and instruction to access the V-Safe system.   Alexa Leon was instructed to call 911 with any severe reactions post vaccine: Marland Kitchen Difficulty breathing  . Swelling of face and throat  . A fast heartbeat  . A bad rash all over body  . Dizziness and weakness   Immunizations Administered    Name Date Dose VIS Date Route   Pfizer COVID-19 Vaccine 03/11/2020 10:32 AM 0.3 mL 11/25/2019 Intramuscular   Manufacturer: [employer], [employer]   Lot: [id number]   NDC: [address]-1

## 2020-04-01 ENCOUNTER — Other Ambulatory Visit: Payer: Self-pay | Admitting: Family Medicine

## 2020-04-01 DIAGNOSIS — F419 Anxiety disorder, unspecified: Secondary | ICD-10-CM

## 2020-04-01 DIAGNOSIS — F329 Major depressive disorder, single episode, unspecified: Secondary | ICD-10-CM

## 2020-04-02 NOTE — Telephone Encounter (Signed)
Left detailed message.   

## 2020-04-02 NOTE — Telephone Encounter (Signed)
Dettinger. NTBSf 30 days given 03/02/20

## 2020-04-16 ENCOUNTER — Other Ambulatory Visit: Payer: Self-pay | Admitting: Family Medicine

## 2020-04-16 DIAGNOSIS — F329 Major depressive disorder, single episode, unspecified: Secondary | ICD-10-CM

## 2020-04-16 DIAGNOSIS — F419 Anxiety disorder, unspecified: Secondary | ICD-10-CM

## 2020-04-16 DIAGNOSIS — F32A Depression, unspecified: Secondary | ICD-10-CM

## 2020-04-16 MED ORDER — ESCITALOPRAM OXALATE 20 MG PO TABS: 20.0000 mg | ORAL_TABLET | Freq: Every day | ORAL | 0 refills | Status: DC

## 2020-04-16 NOTE — Telephone Encounter (Signed)
Pt aware refill sent to pharmacy 

## 2020-04-16 NOTE — Telephone Encounter (Signed)
°  Prescription Request  04/16/2020  What is the name of the medication or equipment? Escitalopram 20 mg Patient been out a week and has appt 5-10  Have you contacted your pharmacy to request a refill? (if applicable) Yes  Which pharmacy would you like this sent to? [facility] on [address] in [place]   Patient notified that their request is being sent to the clinical staff for review and that they should receive a response within 2 business days.

## 2020-04-23 ENCOUNTER — Other Ambulatory Visit: Payer: Self-pay

## 2020-04-23 ENCOUNTER — Encounter: Payer: Self-pay | Admitting: Family Medicine

## 2020-04-23 ENCOUNTER — Ambulatory Visit: Payer: BC Managed Care – PPO | Admitting: Family Medicine

## 2020-04-23 VITALS — BP 111/76 | HR 86 | Temp 97.6°F | Ht 66.0 in | Wt 168.0 lb

## 2020-04-23 DIAGNOSIS — F339 Major depressive disorder, recurrent, unspecified: Secondary | ICD-10-CM

## 2020-04-23 DIAGNOSIS — F419 Anxiety disorder, unspecified: Secondary | ICD-10-CM

## 2020-04-23 DIAGNOSIS — F329 Major depressive disorder, single episode, unspecified: Secondary | ICD-10-CM | POA: Diagnosis not present

## 2020-04-23 MED ORDER — BUPROPION HCL ER (SR) 200 MG PO TB12: 200.0000 mg | ORAL_TABLET | Freq: Every day | ORAL | 3 refills | Status: DC

## 2020-04-23 MED ORDER — ESCITALOPRAM OXALATE 20 MG PO TABS: 20.0000 mg | ORAL_TABLET | Freq: Every day | ORAL | 3 refills | Status: DC

## 2020-04-23 NOTE — Progress Notes (Signed)
BP 111/76   Pulse 86   Temp 97.6 F (36.4 C)   Ht 5\' 6"  (1.676 m)   Wt 168 lb (76.2 kg)   LMP 04/05/2020   SpO2 99%   BMI 27.12 kg/m    Subjective:   Patient ID: 04/07/2020, female    DOB: November 14, 1976, 44 y.o.   MRN: [id number]  HPI: Alexa Leon is a 44 y.o. female presenting on 04/23/2020 for Medical Management of Chronic Issues (discuss refilling medications)   HPI Anxiety and depression Patient is coming in for anxiety and depression.  She just had major loss of her childhood friend that she is grown up with him been best friends with for a long time.  He passed away from cancer at the age of 6 which was devastating and she was there when he was diagnosed with cancer and when he passed away.  Relevant past medical, surgical, family and social history reviewed and updated as indicated. Interim medical history since our last visit reviewed. Allergies and medications reviewed and updated.  Review of Systems  Constitutional: Negative for chills and fever.  Eyes: Negative for visual disturbance.  Respiratory: Negative for chest tightness and shortness of breath.   Cardiovascular: Negative for chest pain and leg swelling.  Musculoskeletal: Negative for back pain and gait problem.  Skin: Negative for rash.  Neurological: Negative for light-headedness and headaches.  Psychiatric/Behavioral: Negative for agitation and behavioral problems.  All other systems reviewed and are negative.   Per HPI unless specifically indicated above   Allergies as of 04/23/2020      Reactions   Amoxicillin Swelling   Swelling lips, blisters in the throat      Medication List       Accurate as of Apr 23, 2020  3:55 PM. If you have any questions, ask your nurse or doctor.        buPROPion 200 MG 12 hr tablet Commonly known as: WELLBUTRIN SR Take 1 tablet (200 mg total) by mouth daily.   escitalopram 20 MG tablet Commonly known as: LEXAPRO Take 1 tablet (20 mg total) by mouth  daily.   fluticasone 50 MCG/ACT nasal spray Commonly known as: FLONASE Place 1 spray into both nostrils 2 (two) times daily as needed for allergies or rhinitis.   hydrOXYzine 25 MG tablet Commonly known as: ATARAX/VISTARIL Take 1 tablet (25 mg total) by mouth 3 (three) times daily as needed.   Mefenamic Acid 250 MG Caps TAKE 2 CAPSULE BY MOUTH THREE TIMES DAILY        Objective:   BP 111/76   Pulse 86   Temp 97.6 F (36.4 C)   Ht 5\' 6"  (1.676 m)   Wt 168 lb (76.2 kg)   LMP 04/05/2020   SpO2 99%   BMI 27.12 kg/m   Wt Readings from Last 3 Encounters:  04/23/20 168 lb (76.2 kg)  02/10/19 162 lb 3.2 oz (73.6 kg)  11/12/18 163 lb 6.4 oz (74.1 kg)    Physical Exam Vitals and nursing note reviewed.  Constitutional:      General: She is not in acute distress.    Appearance: She is well-developed. She is not diaphoretic.  Eyes:     Conjunctiva/sclera: Conjunctivae normal.  Cardiovascular:     Rate and Rhythm: Normal rate and regular rhythm.     Heart sounds: Normal heart sounds. No murmur.  Pulmonary:     Effort: Pulmonary effort is normal. No respiratory distress.     Breath sounds:  Normal breath sounds. No wheezing.  Skin:    General: Skin is warm and dry.     Findings: No rash.  Neurological:     Mental Status: She is alert and oriented to person, place, and time.     Coordination: Coordination normal.  Psychiatric:        Mood and Affect: Mood is anxious and depressed.        Behavior: Behavior normal.       Assessment & Plan:   Problem List Items Addressed This Visit      Other   Depression, recurrent (Frederic) - Primary   Relevant Medications   buPROPion (WELLBUTRIN SR) 200 MG 12 hr tablet   escitalopram (LEXAPRO) 20 MG tablet   Anxiety   Relevant Medications   buPROPion (WELLBUTRIN SR) 200 MG 12 hr tablet   escitalopram (LEXAPRO) 20 MG tablet    Other Visit Diagnoses    Anxiety and depression       Relevant Medications   buPROPion (WELLBUTRIN  SR) 200 MG 12 hr tablet   escitalopram (LEXAPRO) 20 MG tablet      Patient is dealing with loss and grieving and has lost stressors related and is sleeping a lot but she is going through the process and does not feel like it is out of control.  She does have a childhood friend passed away and he was 43 from colon cancer. Follow up plan: Return in about 5 weeks (around 05/28/2020), or if symptoms worsen or fail to improve, for Recheck depression.  Counseling provided for all of the vaccine components No orders of the defined types were placed in this encounter.   Caryl Pina, MD [facility] 04/23/2020, 3:55 PM

## 2020-07-18 IMAGING — US US PELVIS COMPLETE WITH TRANSVAGINAL
2 series · 13 of 25 positions shown · non-contrast
Comparison: None

CLINICAL DATA: 43-year-old female with dysmenorrhea.

EXAM:
TRANSABDOMINAL AND TRANSVAGINAL ULTRASOUND OF PELVIS
TECHNIQUE: Both transabdominal and transvaginal ultrasound examinations of the
pelvis were performed. Transabdominal technique was performed for
global imaging of the pelvis including uterus, ovaries, adnexal
regions, and pelvic cul-de-sac. It was necessary to proceed with
endovaginal exam following the transabdominal exam to visualize the
endometrium and ovaries.

[Series 1: us pelvis complete with transvaginal · 0.22mm/px · 12 of 192 slices shown (1 of 2)]
[im 1/192]
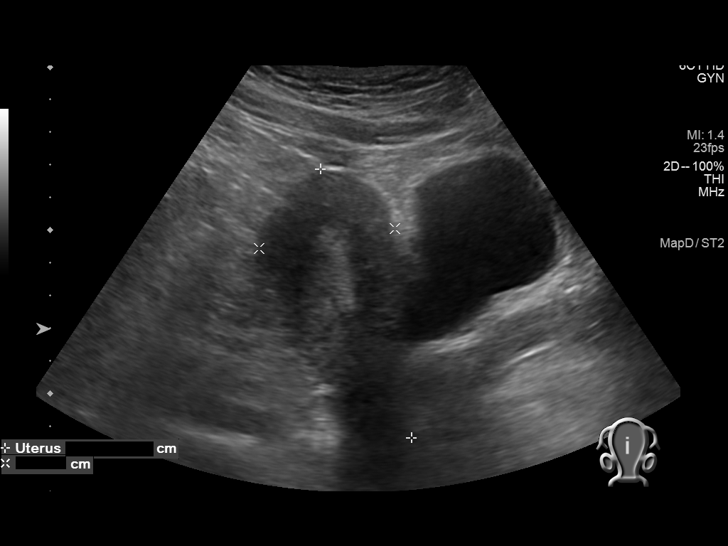
[im 17/192]
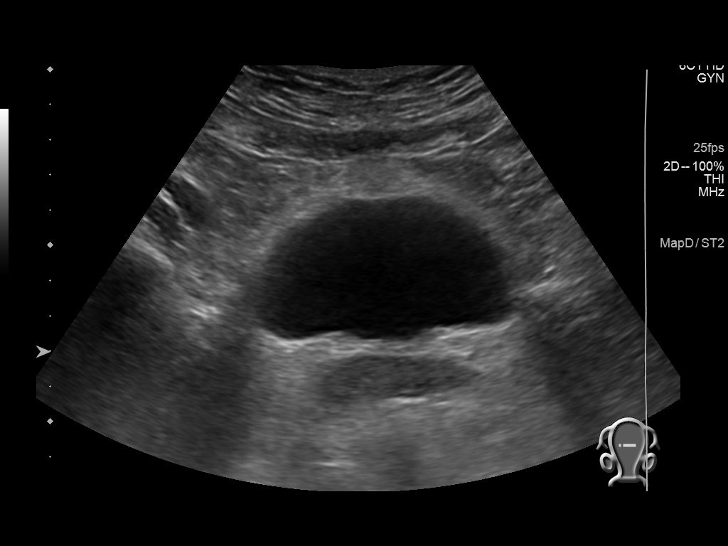
[im 34/192]
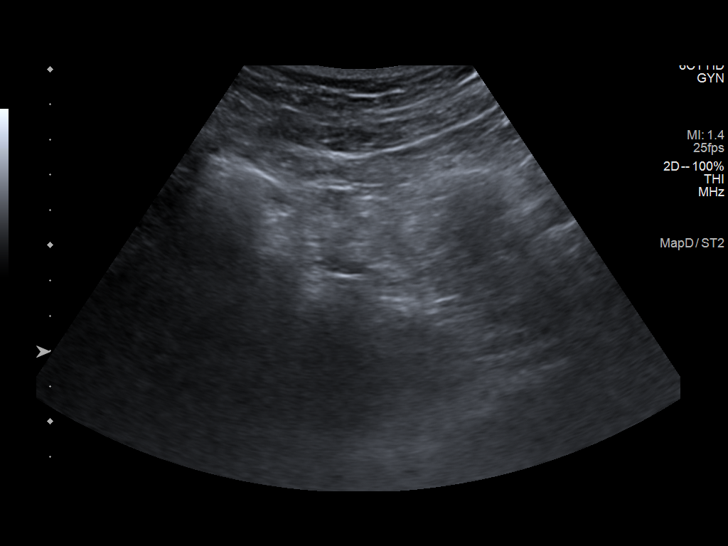
[im 50/192]
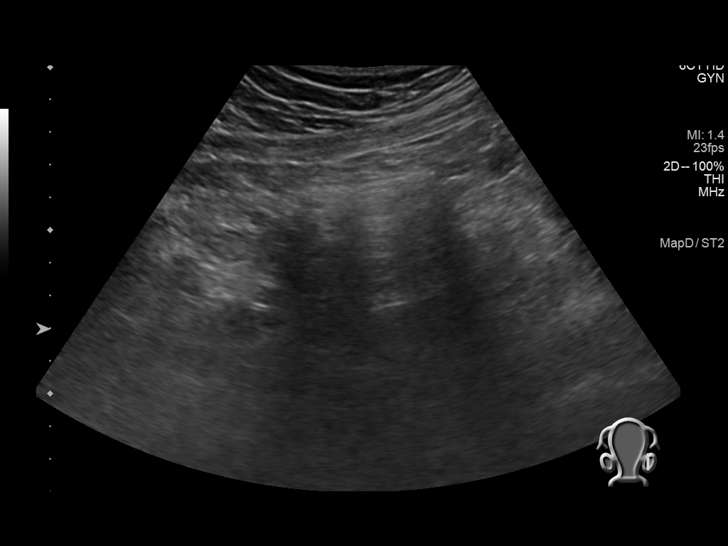
[im 67/192]
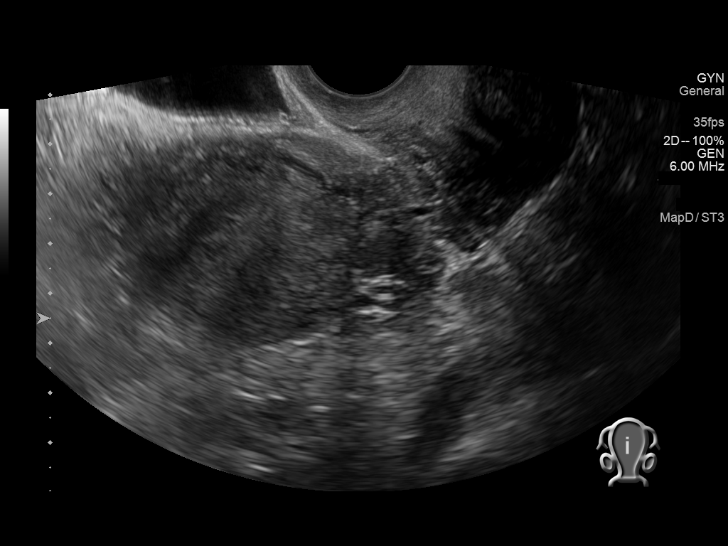
[im 84/192]
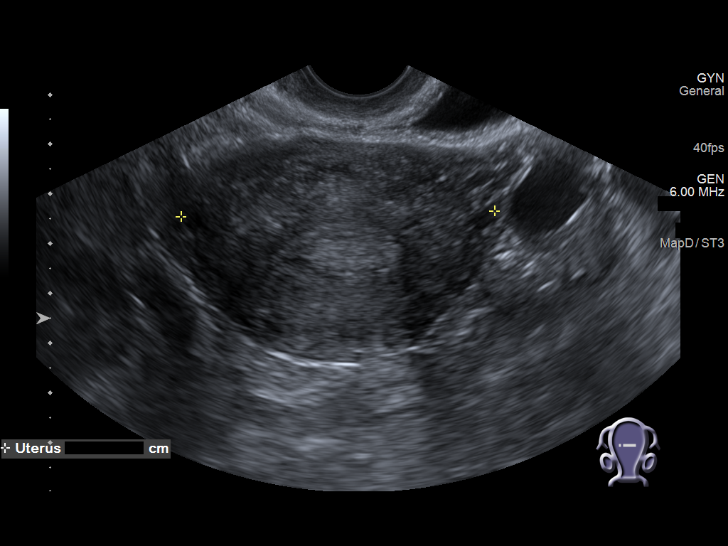
[im 100/192]
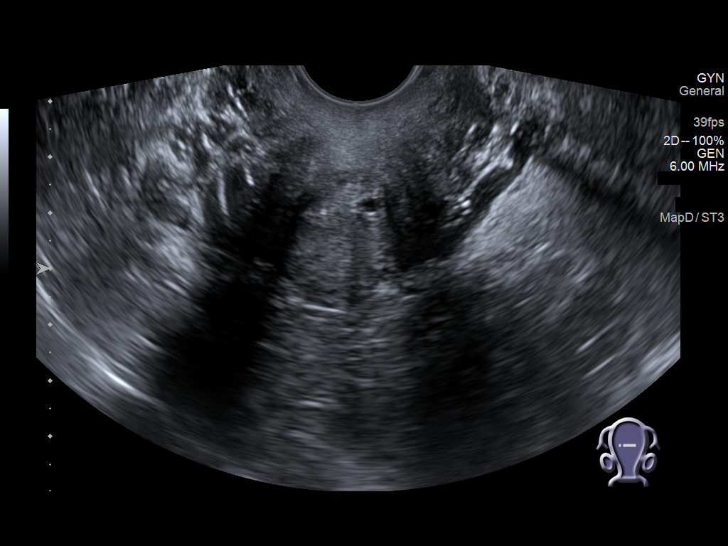
[im 117/192]
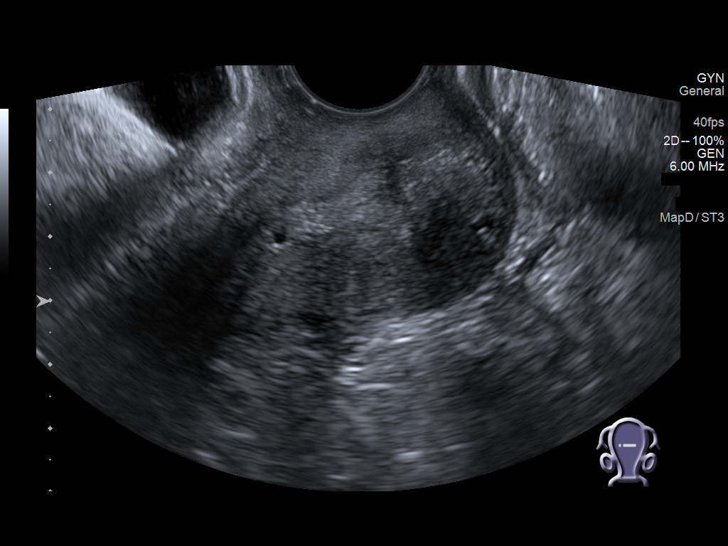
[im 133/192]
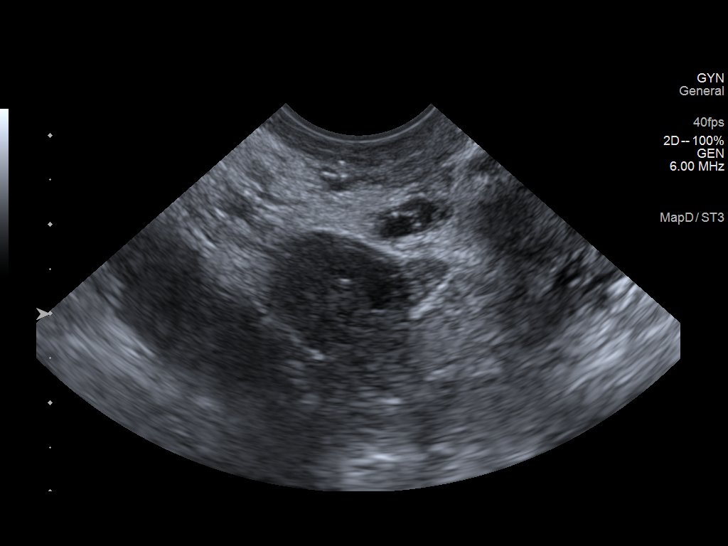
[im 150/192]
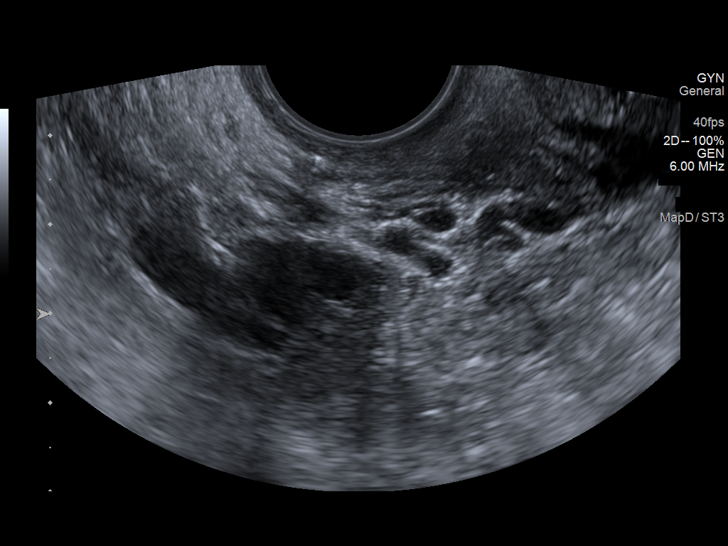
[im 167/192]
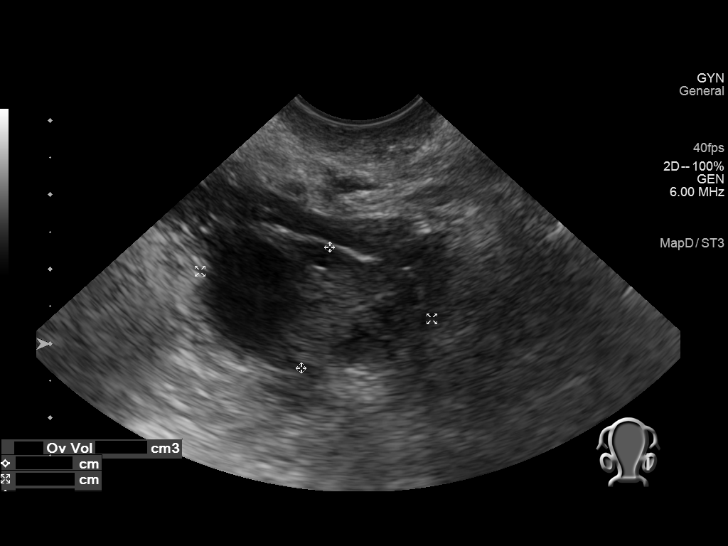
[im 183/192]
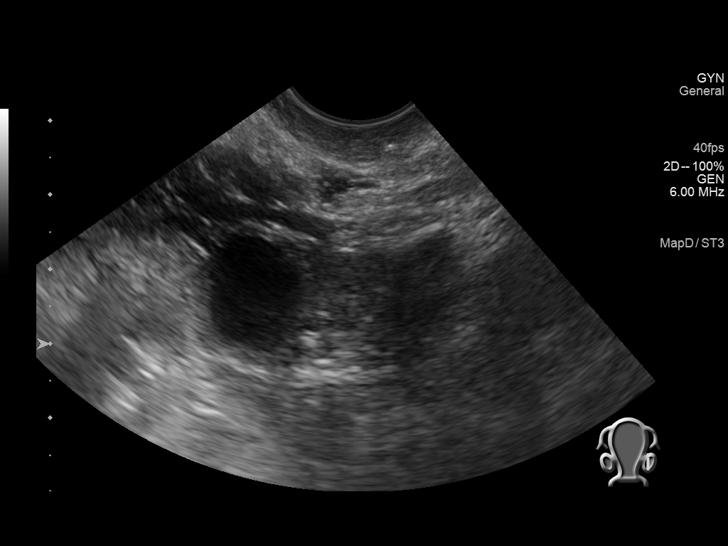

[Series 3: us pelvis complete with transvaginal · 0.13mm/px · 1 of 4 slices shown (2 of 2)]
[im 1/4]
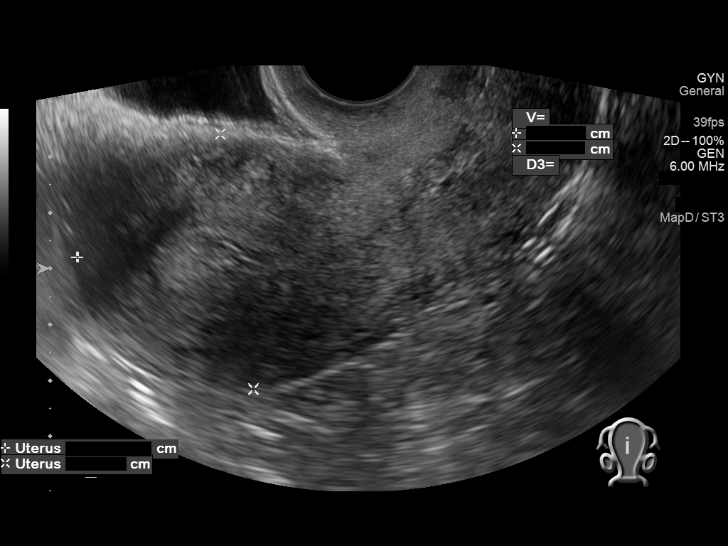

[13 of 25 positions shown; findings below may reference images not displayed]

FINDINGS: Uterus

Measurements: 9.4 x 4.6 x 6.3 cm = volume: 144 mL. The uterus is
anteverted appears unremarkable.

Endometrium

Thickness: 12 mm. No focal abnormality visualized. There is trace
amount of fluid in the endocervical canal.

Right ovary

Measurements: 2.3 x 1.3 x 1.5 cm = volume: 2.7 mL. Normal
appearance/no adnexal mass.

Left ovary

Measurements: 3.2 x 1.7 x 2.8 cm = volume: 7.8 mL. Normal
appearance/no adnexal mass.

Other findings

No abnormal free fluid.
IMPRESSION: Unremarkable pelvic ultrasound. Trace fluid within the endocervical
canal, possibly physiologic.

## 2020-09-06 ENCOUNTER — Encounter: Payer: Self-pay | Admitting: Family Medicine

## 2020-09-06 ENCOUNTER — Other Ambulatory Visit: Payer: Self-pay | Admitting: *Deleted

## 2020-09-06 DIAGNOSIS — F339 Major depressive disorder, recurrent, unspecified: Secondary | ICD-10-CM

## 2020-09-06 DIAGNOSIS — F419 Anxiety disorder, unspecified: Secondary | ICD-10-CM

## 2020-09-06 MED ORDER — ESCITALOPRAM OXALATE 20 MG PO TABS: 20.0000 mg | ORAL_TABLET | Freq: Every day | ORAL | 1 refills | Status: DC

## 2020-10-18 ENCOUNTER — Other Ambulatory Visit: Payer: Self-pay | Admitting: Family Medicine

## 2020-10-18 DIAGNOSIS — F32A Depression, unspecified: Secondary | ICD-10-CM

## 2020-10-18 DIAGNOSIS — F419 Anxiety disorder, unspecified: Secondary | ICD-10-CM

## 2020-10-18 DIAGNOSIS — F339 Major depressive disorder, recurrent, unspecified: Secondary | ICD-10-CM

## 2021-01-24 ENCOUNTER — Ambulatory Visit: Payer: BC Managed Care – PPO | Attending: Internal Medicine

## 2021-01-24 DIAGNOSIS — Z23 Encounter for immunization: Secondary | ICD-10-CM

## 2021-01-24 NOTE — Progress Notes (Signed)
   Covid-19 Vaccination Clinic  Name:  Alexa Leon    MRN: [id number] DOB: 05-09-1976  01/24/2021  [name] was observed post Covid-19 immunization for 15 minutes without incident. She was provided with Vaccine Information Sheet and instruction to access the V-Safe system.   [name] was instructed to call 911 with any severe reactions post vaccine: Marland Kitchen Difficulty breathing  . Swelling of face and throat  . A fast heartbeat  . A bad rash all over body  . Dizziness and weakness   Immunizations Administered    Name Date Dose VIS Date Route   PFIZER Comrnaty(Gray TOP) Covid-19 Vaccine 01/24/2021  3:46 PM 0.3 mL 11/22/2020 Intramuscular   Manufacturer: [employer], [employer]   Lot: [id number]   NDC: [phone]

## 2021-01-25 ENCOUNTER — Other Ambulatory Visit: Payer: Self-pay | Admitting: Family Medicine

## 2021-01-25 DIAGNOSIS — F32A Depression, unspecified: Secondary | ICD-10-CM

## 2021-01-25 DIAGNOSIS — F419 Anxiety disorder, unspecified: Secondary | ICD-10-CM

## 2021-01-25 DIAGNOSIS — F339 Major depressive disorder, recurrent, unspecified: Secondary | ICD-10-CM

## 2021-03-01 ENCOUNTER — Ambulatory Visit: Payer: BC Managed Care – PPO | Admitting: Family Medicine

## 2021-03-01 ENCOUNTER — Other Ambulatory Visit: Payer: Self-pay

## 2021-03-01 ENCOUNTER — Encounter: Payer: Self-pay | Admitting: Family Medicine

## 2021-03-01 VITALS — BP 91/61 | HR 79 | Ht 66.0 in | Wt 168.0 lb

## 2021-03-01 DIAGNOSIS — F339 Major depressive disorder, recurrent, unspecified: Secondary | ICD-10-CM

## 2021-03-01 DIAGNOSIS — N946 Dysmenorrhea, unspecified: Secondary | ICD-10-CM

## 2021-03-01 DIAGNOSIS — F32A Depression, unspecified: Secondary | ICD-10-CM | POA: Diagnosis not present

## 2021-03-01 DIAGNOSIS — F419 Anxiety disorder, unspecified: Secondary | ICD-10-CM | POA: Diagnosis not present

## 2021-03-01 MED ORDER — ESCITALOPRAM OXALATE 20 MG PO TABS: 20.0000 mg | ORAL_TABLET | Freq: Every day | ORAL | 3 refills | Status: DC

## 2021-03-01 MED ORDER — LEVONORGEST-ETH ESTRAD 91-DAY 0.15-0.03 MG PO TABS: 1.0000 | ORAL_TABLET | Freq: Every day | ORAL | 3 refills | Status: DC

## 2021-03-01 NOTE — Progress Notes (Signed)
BP 91/61   Pulse 79   Ht 5\' 6"  (1.676 m)   Wt 168 lb (76.2 kg)   SpO2 100%   BMI 27.12 kg/m    Subjective:   Patient ID: , female    DOB: [birth year], 45 y.o.   MRN: [id number]  HPI: Alexa Leon is a 45 y.o. female presenting on 03/01/2021 for Anxiety   HPI Anxiety recheck Patient is coming in today for anxiety recheck.  She is taking the Lexapro currently, she did the Wellbutrin for a little period of time and then stopped it and does not feel like she needs it and feels like she is doing really well with the Lexapro, she still has a little bit anxiety but nothing major. Denies any suicidal ideations or thoughts of hurting self.  She does have passing thoughts of feeling better off dead but has a lot of challenges with her autistic son.  And has a lot of worries about him.  Patient has been having heavy menstrual cramping and severe cramping when she has periods.  She does not have the pain currently but she says when she does have her menstrual cycles the pain can be so severe that it doubles her over and she has to miss work sometimes.  She did try medicine that was sent in for her and it did not help.  Relevant past medical, surgical, family and social history reviewed and updated as indicated. Interim medical history since our last visit reviewed. Allergies and medications reviewed and updated.  Review of Systems  Constitutional: Negative for chills and fever.  Eyes: Negative for visual disturbance.  Respiratory: Negative for chest tightness and shortness of breath.   Cardiovascular: Negative for chest pain and leg swelling.  Genitourinary: Positive for menstrual problem and pelvic pain. Negative for dysuria, flank pain and hematuria.  Musculoskeletal: Negative for back pain and gait problem.  Skin: Negative for rash.  Neurological: Negative for light-headedness and headaches.  Psychiatric/Behavioral: Negative for agitation and behavioral problems.  All  other systems reviewed and are negative.   Per HPI unless specifically indicated above   Allergies as of 03/01/2021      Reactions   Amoxicillin Swelling   Swelling lips, blisters in the throat      Medication List       Accurate as of March 01, 2021  2:41 PM. If you have any questions, ask your nurse or doctor.        buPROPion 200 MG 12 hr tablet Commonly known as: WELLBUTRIN SR Take 1 tablet (200 mg total) by mouth daily.   escitalopram 20 MG tablet Commonly known as: LEXAPRO Take 1 tablet (20 mg total) by mouth daily. (Needs to be seen before next refill)   fluticasone 50 MCG/ACT nasal spray Commonly known as: FLONASE Place 1 spray into both nostrils 2 (two) times daily as needed for allergies or rhinitis.   hydrOXYzine 25 MG tablet Commonly known as: ATARAX/VISTARIL Take 1 tablet (25 mg total) by mouth 3 (three) times daily as needed.   Mefenamic Acid 250 MG Caps TAKE 2 CAPSULE BY MOUTH THREE TIMES DAILY        Objective:   BP 91/61   Pulse 79   Ht 5\' 6"  (1.676 m)   Wt 168 lb (76.2 kg)   SpO2 100%   BMI 27.12 kg/m   Wt Readings from Last 3 Encounters:  03/01/21 168 lb (76.2 kg)  04/23/20 168 lb (76.2 kg)  02/10/19 162 lb  3.2 oz (73.6 kg)    Physical Exam Vitals and nursing note reviewed.  Constitutional:      General: She is not in acute distress.    Appearance: She is well-developed. She is not diaphoretic.  Eyes:     Conjunctiva/sclera: Conjunctivae normal.  Neurological:     Mental Status: She is alert and oriented to person, place, and time.     Coordination: Coordination normal.  Psychiatric:        Mood and Affect: Mood is not anxious or depressed.        Behavior: Behavior normal.        Thought Content: Thought content does not include suicidal ideation. Thought content does not include suicidal plan.       Assessment & Plan:   Problem List Items Addressed This Visit      Other   Depression, recurrent (HCC)   Relevant  Medications   escitalopram (LEXAPRO) 20 MG tablet   Anxiety - Primary   Relevant Medications   escitalopram (LEXAPRO) 20 MG tablet    Other Visit Diagnoses    Severe dysmenorrhea       Relevant Medications   levonorgestrel-ethinyl estradiol (SEASONALE) 0.15-0.03 MG tablet   Anxiety and depression       Relevant Medications   escitalopram (LEXAPRO) 20 MG tablet      Continue Lexapro, will start Seasonale to help see if that controls her cycles. Follow up plan: Return if symptoms worsen or fail to improve, for Well woman exam and Pap smear in 3 to 4 months during the summer.  Counseling provided for all of the vaccine components No orders of the defined types were placed in this encounter.   Arville Care, MD [facility] 03/01/2021, 2:41 PM

## 2021-06-08 ENCOUNTER — Other Ambulatory Visit: Payer: Self-pay | Admitting: Family Medicine

## 2021-06-08 DIAGNOSIS — F32A Depression, unspecified: Secondary | ICD-10-CM

## 2021-06-08 DIAGNOSIS — F339 Major depressive disorder, recurrent, unspecified: Secondary | ICD-10-CM

## 2021-06-08 DIAGNOSIS — F419 Anxiety disorder, unspecified: Secondary | ICD-10-CM

## 2021-09-07 ENCOUNTER — Other Ambulatory Visit: Payer: Self-pay | Admitting: Family Medicine

## 2021-09-07 DIAGNOSIS — F32A Depression, unspecified: Secondary | ICD-10-CM

## 2021-09-07 DIAGNOSIS — F339 Major depressive disorder, recurrent, unspecified: Secondary | ICD-10-CM

## 2021-09-07 DIAGNOSIS — F419 Anxiety disorder, unspecified: Secondary | ICD-10-CM

## 2021-10-07 ENCOUNTER — Other Ambulatory Visit: Payer: Self-pay | Admitting: Family Medicine

## 2021-10-07 DIAGNOSIS — F419 Anxiety disorder, unspecified: Secondary | ICD-10-CM

## 2021-10-07 DIAGNOSIS — F339 Major depressive disorder, recurrent, unspecified: Secondary | ICD-10-CM

## 2021-10-07 DIAGNOSIS — F32A Depression, unspecified: Secondary | ICD-10-CM

## 2021-10-08 NOTE — Telephone Encounter (Signed)
Dettinger. NTBS 30 days given 09/09/21

## 2021-10-10 ENCOUNTER — Other Ambulatory Visit: Payer: Self-pay | Admitting: Family Medicine

## 2021-10-10 DIAGNOSIS — F339 Major depressive disorder, recurrent, unspecified: Secondary | ICD-10-CM

## 2021-10-10 DIAGNOSIS — F419 Anxiety disorder, unspecified: Secondary | ICD-10-CM

## 2021-10-14 ENCOUNTER — Other Ambulatory Visit: Payer: Self-pay | Admitting: Family Medicine

## 2021-10-14 DIAGNOSIS — F32A Depression, unspecified: Secondary | ICD-10-CM

## 2021-10-14 DIAGNOSIS — F419 Anxiety disorder, unspecified: Secondary | ICD-10-CM

## 2021-10-14 DIAGNOSIS — F339 Major depressive disorder, recurrent, unspecified: Secondary | ICD-10-CM

## 2021-10-15 MED ORDER — ESCITALOPRAM OXALATE 20 MG PO TABS: 20.0000 mg | ORAL_TABLET | Freq: Every day | ORAL | 0 refills | Status: DC

## 2021-10-15 NOTE — Telephone Encounter (Signed)
Pt called regarding refill request for Lexapro. Made an appt to see Dr Dettinger on 11/9. Needs refill called in to last her until her appt.

## 2021-10-23 ENCOUNTER — Encounter: Payer: Self-pay | Admitting: Family Medicine

## 2021-10-23 ENCOUNTER — Other Ambulatory Visit: Payer: Self-pay

## 2021-10-23 ENCOUNTER — Ambulatory Visit: Payer: BC Managed Care – PPO | Admitting: Family Medicine

## 2021-10-23 VITALS — BP 110/68 | HR 97 | Ht 66.0 in | Wt 166.0 lb

## 2021-10-23 DIAGNOSIS — F339 Major depressive disorder, recurrent, unspecified: Secondary | ICD-10-CM

## 2021-10-23 DIAGNOSIS — F419 Anxiety disorder, unspecified: Secondary | ICD-10-CM | POA: Diagnosis not present

## 2021-10-23 DIAGNOSIS — F32A Depression, unspecified: Secondary | ICD-10-CM

## 2021-10-23 MED ORDER — ESCITALOPRAM OXALATE 20 MG PO TABS: 20.0000 mg | ORAL_TABLET | Freq: Every day | ORAL | 3 refills | Status: DC

## 2021-10-23 NOTE — Progress Notes (Signed)
BP 110/68   Pulse 97   Ht 5\' 6"  (1.676 m)   Wt 166 lb (75.3 kg)   SpO2 100%   BMI 26.79 kg/m    Subjective:   Patient ID: , female    DOB: November 23, 1976, 45 y.o.   MRN: [id number]  HPI: Alexa Leon is a 45 y.o. female presenting on 10/23/2021 for Medical Management of Chronic Issues and Anxiety   HPI Anxiety and depression Patient is coming in today for anxiety and depression recheck.  She says has been doing good on the Lexapro.  She said she did have a couple days last week where she ran out of and that was a little bit tougher.  She says she is also had recently his cousin that close to her commit suicide and that did affect her some but she says the medication has been helping her move along and deal with the grief and she denies any major panic episodes or feeling depressed and she feels pretty happy and content with where her medication is doing for her. Depression screen [facility] 2/9 10/23/2021 03/01/2021 04/24/2020 02/10/2019 11/12/2018  Decreased Interest 1 1 2  0 0  Down, Depressed, Hopeless 1 1 2 1 1   PHQ - 2 Score 2 2 4 1 1   Altered sleeping 1 2 3  - -  Tired, decreased energy 1 3 2  - -  Change in appetite 0 0 2 - -  Feeling bad or failure about yourself  1 1 1  - -  Trouble concentrating 0 1 1 - -  Moving slowly or fidgety/restless 0 0 1 - -  Suicidal thoughts 0 1 0 - -  PHQ-9 Score 5 10 14  - -  Difficult doing work/chores - - - - -  Some recent data might be hidden     Relevant past medical, surgical, family and social history reviewed and updated as indicated. Interim medical history since our last visit reviewed. Allergies and medications reviewed and updated.  Review of Systems  Constitutional:  Negative for chills and fever.  Eyes:  Negative for visual disturbance.  Respiratory:  Negative for chest tightness and shortness of breath.   Cardiovascular:  Negative for chest pain and leg swelling.  Skin:  Negative for rash.  Neurological:  Negative for  light-headedness and headaches.  Psychiatric/Behavioral:  Negative for agitation, behavioral problems, dysphoric mood and sleep disturbance. The patient is not nervous/anxious.   All other systems reviewed and are negative.  Per HPI unless specifically indicated above   Allergies as of 10/23/2021       Reactions   Amoxicillin Swelling   Swelling lips, blisters in the throat        Medication List        Accurate as of October 23, 2021  3:47 PM. If you have any questions, ask your nurse or doctor.          STOP taking these medications    levonorgestrel-ethinyl estradiol 0.15-0.03 MG tablet Commonly known as: SEASONALE Stopped by: Alexa Fanfan, MD       TAKE these medications    escitalopram 20 MG tablet Commonly known as: LEXAPRO Take 1 tablet (20 mg total) by mouth daily. What changed: additional instructions Changed by: Alexa Mclees, MD   fluticasone 50 MCG/ACT nasal spray Commonly known as: FLONASE Place 1 spray into both nostrils 2 (two) times daily as needed for allergies or rhinitis.   hydrOXYzine 25 MG tablet Commonly known as: ATARAX/VISTARIL Take 1 tablet (  25 mg total) by mouth 3 (three) times daily as needed.         Objective:   BP 110/68   Pulse 97   Ht 5\' 6"  (1.676 m)   Wt 166 lb (75.3 kg)   SpO2 100%   BMI 26.79 kg/m   Wt Readings from Last 3 Encounters:  10/23/21 166 lb (75.3 kg)  03/01/21 168 lb (76.2 kg)  04/23/20 168 lb (76.2 kg)    Physical Exam Vitals and nursing note reviewed.  Constitutional:      General: She is not in acute distress.    Appearance: She is well-developed. She is not diaphoretic.  Eyes:     Conjunctiva/sclera: Conjunctivae normal.  Musculoskeletal:        General: No tenderness.  Skin:    General: Skin is warm and dry.     Findings: No rash.  Neurological:     Mental Status: She is alert and oriented to person, place, and time.     Coordination: Coordination normal.  Psychiatric:         Behavior: Behavior normal.      Assessment & Plan:   Problem List Items Addressed This Visit       Other   Depression, recurrent ([place])   Relevant Medications   escitalopram (LEXAPRO) 20 MG tablet   Anxiety - Primary   Relevant Medications   escitalopram (LEXAPRO) 20 MG tablet   Other Visit Diagnoses     Anxiety and depression       Relevant Medications   escitalopram (LEXAPRO) 20 MG tablet       She feels like she is doing pretty good and will keep the Lexapro going. Follow up plan: Return in about 6 months (around 04/22/2022), or if symptoms worsen or fail to improve, for Physical and Pap.  Counseling provided for all of the vaccine components No orders of the defined types were placed in this encounter.   Caryl Pina, MD [facility] 10/23/2021, 3:47 PM

## 2021-11-13 ENCOUNTER — Other Ambulatory Visit: Payer: Self-pay | Admitting: Family Medicine

## 2021-11-13 DIAGNOSIS — F419 Anxiety disorder, unspecified: Secondary | ICD-10-CM

## 2021-11-13 DIAGNOSIS — F32A Depression, unspecified: Secondary | ICD-10-CM

## 2021-11-13 DIAGNOSIS — F339 Major depressive disorder, recurrent, unspecified: Secondary | ICD-10-CM

## 2022-07-02 HISTORY — PX: HYSTEROSCOPY WITH D & C: SHX1775

## 2022-10-31 ENCOUNTER — Encounter: Payer: Self-pay | Admitting: Nurse Practitioner

## 2022-10-31 ENCOUNTER — Other Ambulatory Visit: Payer: Self-pay | Admitting: Nurse Practitioner

## 2022-10-31 ENCOUNTER — Ambulatory Visit: Payer: BC Managed Care – PPO | Admitting: Nurse Practitioner

## 2022-10-31 VITALS — BP 87/59 | HR 93 | Temp 97.9°F | Ht 66.0 in | Wt 179.2 lb

## 2022-10-31 DIAGNOSIS — J029 Acute pharyngitis, unspecified: Secondary | ICD-10-CM | POA: Diagnosis not present

## 2022-10-31 DIAGNOSIS — R051 Acute cough: Secondary | ICD-10-CM

## 2022-10-31 DIAGNOSIS — R5383 Other fatigue: Secondary | ICD-10-CM | POA: Diagnosis not present

## 2022-10-31 LAB — CBC WITH DIFFERENTIAL/PLATELET
Basophils Absolute: 0.1 10*3/uL (ref 0.0–0.2)
Basos: 1 %
EOS (ABSOLUTE): 0.4 10*3/uL (ref 0.0–0.4)
Eos: 5 %
Hematocrit: 42.1 % (ref 34.0–46.6)
Hemoglobin: 13.4 g/dL (ref 11.1–15.9)
Immature Grans (Abs): 0 10*3/uL (ref 0.0–0.1)
Immature Granulocytes: 0 %
Lymphocytes Absolute: 2.2 10*3/uL (ref 0.7–3.1)
Lymphs: 27 %
MCH: 27.1 pg (ref 26.6–33.0)
MCHC: 31.8 g/dL (ref 31.5–35.7)
MCV: 85 fL (ref 79–97)
Monocytes Absolute: 0.7 10*3/uL (ref 0.1–0.9)
Monocytes: 8 %
Neutrophils Absolute: 4.9 10*3/uL (ref 1.4–7.0)
Neutrophils: 59 %
Platelets: 351 10*3/uL (ref 150–450)
RBC: 4.95 x10E6/uL (ref 3.77–5.28)
RDW: 13.8 % (ref 11.7–15.4)
WBC: 8.3 10*3/uL (ref 3.4–10.8)

## 2022-10-31 LAB — CULTURE, GROUP A STREP

## 2022-10-31 LAB — RAPID STREP SCREEN (MED CTR MEBANE ONLY): Strep Gp A Ag, IA W/Reflex: NEGATIVE

## 2022-10-31 MED ORDER — BENZONATATE 100 MG PO CAPS: 100.0000 mg | ORAL_CAPSULE | Freq: Three times a day (TID) | ORAL | 0 refills | Status: DC | PRN

## 2022-10-31 MED ORDER — MOMETASONE FUROATE 50 MCG/ACT NA SUSP: 2.0000 | Freq: Every day | NASAL | 12 refills | Status: DC

## 2022-10-31 MED ORDER — GUAIFENESIN ER 600 MG PO TB12: 600.0000 mg | ORAL_TABLET | Freq: Two times a day (BID) | ORAL | 0 refills | Status: DC

## 2022-10-31 NOTE — Patient Instructions (Signed)
Cough, Adult A cough helps to clear your throat and lungs. A cough may be a sign of an illness or another medical condition. An acute cough may only last 2-3 weeks, while a chronic cough may last 8 or more weeks. Many things can cause a cough. They include: Germs (viruses or bacteria) that attack the airway. Breathing in things that bother (irritate) your lungs. Allergies. Asthma. Mucus that runs down the back of your throat (postnasal drip). Smoking. Acid backing up from the stomach into the tube that moves food from the mouth to the stomach (gastroesophageal reflux). Some medicines. Lung problems. Other medical conditions, such as heart failure or a blood clot in the lung (pulmonary embolism). Follow these instructions at home: Medicines Take over-the-counter and prescription medicines only as told by your doctor. Talk with your doctor before you take medicines that stop a cough (cough suppressants). Lifestyle  Do not smoke, and try not to be around smoke. Do not use any products that contain nicotine or tobacco, such as cigarettes, e-cigarettes, and chewing tobacco. If you need help quitting, ask your doctor. Drink enough fluid to keep your pee (urine) pale yellow. Avoid caffeine. Do not drink alcohol if your doctor tells you not to drink. General instructions  Watch for any changes in your cough. Tell your doctor about them. Always cover your mouth when you cough. Stay away from things that make you cough, such as perfume, candles, campfire smoke, or cleaning products. If the air is dry, use a cool mist vaporizer or humidifier in your home. If your cough is worse at night, try using extra pillows to raise your head up higher while you sleep. Rest as needed. Keep all follow-up visits as told by your doctor. This is important. Contact a doctor if: You have new symptoms. You cough up pus. Your cough does not get better after 2-3 weeks, or your cough gets worse. Cough medicine  does not help your cough and you are not sleeping well. You have pain that gets worse or pain that is not helped with medicine. You have a fever. You are losing weight and you do not know why. You have night sweats. Get help right away if: You cough up blood. You have trouble breathing. Your heartbeat is very fast. These symptoms may be an emergency. Do not wait to see if the symptoms will go away. Get medical help right away. Call your local emergency services (911 in the [place] Do not drive yourself to the hospital. Summary A cough helps to clear your throat and lungs. Many things can cause a cough. Take over-the-counter and prescription medicines only as told by your doctor. Always cover your mouth when you cough. Contact a doctor if you have new symptoms or you have a cough that does not get better or gets worse. This information is not intended to replace advice given to you by your health care provider. Make sure you discuss any questions you have with your health care provider. Document Revised: 01/20/2020 Document Reviewed: 12/20/2018 Elsevier Patient Education  2023 [employer] Sore Throat When you have a sore throat, your throat may feel: Tender. Burning. Irritated. Scratchy. Painful when you swallow. Painful when you talk. Many things can cause a sore throat, such as: An infection. Allergies. Dry air. Smoke or pollution. Radiation treatment for cancer. Gastroesophageal reflux disease (GERD). A tumor. A sore throat can be the first sign of another sickness. It can happen with other problems, like: Coughing. Sneezing. Fever. Swelling of   the glands in the neck. Most sore throats go away without treatment. Follow these instructions at home:     Medicines Take over-the-counter and prescription medicines only as told by your doctor. Children often get sore throats. Do not give your child aspirin. Use throat sprays to soothe your throat as told by your health  care provider. Managing pain To help with pain: Sip warm liquids, such as broth, herbal tea, or warm water. Eat or drink cold or frozen liquids, such as frozen ice pops. Rinse your mouth (gargle) with a salt water mixture 3-4 times a day or as needed. To make salt water, dissolve -1 tsp (3-6 g) of salt in 1 cup (237 mL) of warm water. Do not swallow this mixture. Suck on hard candy or throat lozenges. Put a cool-mist humidifier in your bedroom at night. Sit in the bathroom with the door closed for 5-10 minutes while you run hot water in the shower. General instructions Do not smoke or use any products that contain nicotine or tobacco. If you need help quitting, ask your doctor. Get plenty of rest. Drink enough fluid to keep your pee (urine) pale yellow. Wash your hands often for at least 20 seconds with soap and water. If soap and water are not available, use hand sanitizer. Contact a doctor if: You have a fever for more than 2-3 days. You keep having symptoms for more than 2-3 days. Your throat does not get better in 7 days. You have a fever and your symptoms suddenly get worse. Your child who is 3 months to 76 years old has a temperature of 102.3F (39C) or higher. Get help right away if: You have trouble breathing. You cannot swallow fluids, soft foods, or your spit. You have swelling in your throat or neck that gets worse. You feel like you may vomit (nauseous) and this feeling lasts a long time. You cannot stop vomiting. These symptoms may be an emergency. Get help right away. Call your local emergency services (911 in the [place] Do not wait to see if the symptoms will go away. Do not drive yourself to the hospital. Summary A sore throat is a painful, burning, irritated, or scratchy throat. Many things can cause a sore throat. Take over-the-counter medicines only as told by your doctor. Get plenty of rest. Drink enough fluid to keep your pee (urine) pale yellow. Contact a  doctor if your symptoms get worse or your sore throat does not get better within 7 days. This information is not intended to replace advice given to you by your health care provider. Make sure you discuss any questions you have with your health care provider. Document Revised: 02/27/2021 Document Reviewed: 02/27/2021 Elsevier Patient Education  2023 [employer].

## 2022-10-31 NOTE — Progress Notes (Signed)
Acute Office Visit  Subjective:     Patient ID: Alexa Leon, female    DOB: [birth year], 46 y.o.   MRN: [id number]  Chief Complaint  Patient presents with   Cough   Fatigue   Sore Throat    Cough This is a new problem. The current episode started yesterday. The problem has been unchanged. The cough is Non-productive. Associated symptoms include a sore throat. Pertinent negatives include no chills, fever, shortness of breath, weight loss or wheezing. Nothing aggravates the symptoms. She has tried nothing for the symptoms.  Sore Throat  This is a new problem. The current episode started yesterday. The problem has been unchanged. The pain is moderate. Associated symptoms include coughing. Pertinent negatives include no congestion or shortness of breath. She has had exposure to strep.  Fatigue  She reports new onset fatigue which she describes as a lack of energy. It began about a week ago and occurs a few days a week. It is described as moderate and staying constant. She has not started new medications around the time the fatigue started.   Associated symptoms: No arthralgias No bleeding  No melena No chest discomfort  No heart palpitations No heart racing   No dyspnea Yes feeling depressed  Yes feeling anxious or under stress No fevers  No loss of appetite No nausea  No vomiting Yes sleeping problems    Wt Readings from Last 3 Encounters:  10/31/22 179 lb 3.2 oz (81.3 kg)  10/23/21 166 lb (75.3 kg)  03/01/21 168 lb (76.2 kg)    Lab Results  Component Value Date   WBC 7.9 11/12/2018   HGB 13.7 11/12/2018   HCT 42.1 11/12/2018   MCV 88 11/12/2018   PLT 330 11/12/2018   Lab Results  Component Value Date   TSH 1.950 09/28/2017   Lab Results  Component Value Date   NA 138 07/21/2018   K 4.9 07/21/2018   CO2 21 07/21/2018   BUN 13 07/21/2018   CREATININE 0.89 07/21/2018   CALCIUM 9.5 07/21/2018   GLUCOSE 84 07/21/2018      ---------------------------------------------------------------------------------------------------   Review of Systems  Constitutional:  Positive for malaise/fatigue. Negative for chills, fever and weight loss.  HENT:  Positive for sore throat. Negative for congestion.   Eyes: Negative.   Respiratory:  Positive for cough. Negative for shortness of breath and wheezing.   Cardiovascular: Negative.   Gastrointestinal: Negative.   Genitourinary: Negative.   Skin: Negative.   Neurological: Negative.   All other systems reviewed and are negative.       Objective:    BP (!) 87/59   Pulse 93   Temp 97.9 F (36.6 C) (Temporal)   Ht 5\' 6"  (1.676 m)   Wt 179 lb 3.2 oz (81.3 kg)   SpO2 100%   BMI 28.92 kg/m  BP Readings from Last 3 Encounters:  10/31/22 (!) 87/59  10/23/21 110/68  03/01/21 91/61   Wt Readings from Last 3 Encounters:  10/31/22 179 lb 3.2 oz (81.3 kg)  10/23/21 166 lb (75.3 kg)  03/01/21 168 lb (76.2 kg)      Physical Exam Vitals and nursing note reviewed.  Constitutional:      Appearance: She is well-developed.  HENT:     Head: Normocephalic.  Eyes:     Conjunctiva/sclera: Conjunctivae normal.  Cardiovascular:     Rate and Rhythm: Normal rate.  Pulmonary:     Effort: Pulmonary effort is normal.     Breath sounds:  Normal breath sounds.  Skin:    General: Skin is warm.  Neurological:     General: No focal deficit present.     Mental Status: She is alert and oriented to person, place, and time.     No results found for any visits on 10/31/22.      Assessment & Plan:  Patient presents with symptoms of cough, headache and weakness in the past 2 days. Patient works in [employer] school and has been exposed to covid-19 and RSV.  Take meds as prescribed - Use a cool mist humidifier  -Use saline nose sprays frequently -Force fluids -For fever or aches or pains- take Tylenol or ibuprofen. - COVID-19, strep, flu and RSV swab completed results  pending.    Concerning patients weakness and fatigue, completed CBC to assess anemia.    Follow up with worsening unresolved symptoms    Problem List Items Addressed This Visit   None Visit Diagnoses     Acute cough    -  Primary   Relevant Medications   mometasone (NASONEX) 50 MCG/ACT nasal spray   guaiFENesin (MUCINEX) 600 MG 12 hr tablet   benzonatate (TESSALON PERLES) 100 MG capsule   Other Relevant Orders   COVID-19, Flu A+B and RSV   Rapid Strep Screen ([facility] ONLY)   Sorethroat       Relevant Orders   COVID-19, Flu A+B and RSV   Rapid Strep Screen ([facility] ONLY)   Fatigue, unspecified type       Relevant Orders   CBC with Differential       Meds ordered this encounter  Medications   mometasone (NASONEX) 50 MCG/ACT nasal spray    Sig: Place 2 sprays into the nose daily.    Dispense:  1 each    Refill:  12    Order Specific Question:   Supervising Provider    Answer:   Mechele Claude [[id number]]   guaiFENesin (MUCINEX) 600 MG 12 hr tablet    Sig: Take 1 tablet (600 mg total) by mouth 2 (two) times daily.    Dispense:  30 tablet    Refill:  0    Order Specific Question:   Supervising Provider    Answer:   Standley Brooking   benzonatate (TESSALON PERLES) 100 MG capsule    Sig: Take 1 capsule (100 mg total) by mouth 3 (three) times daily as needed.    Dispense:  20 capsule    Refill:  0    Order Specific Question:   Supervising Provider    Answer:   Mechele Claude [[id number]]    Return if symptoms worsen or fail to improve.  Daryll Drown, NP

## 2022-11-01 LAB — COVID-19, FLU A+B AND RSV
Influenza A, NAA: NOT DETECTED
Influenza B, NAA: NOT DETECTED
RSV, NAA: NOT DETECTED
SARS-CoV-2, NAA: NOT DETECTED

## 2022-11-11 ENCOUNTER — Other Ambulatory Visit: Payer: Self-pay | Admitting: Family Medicine

## 2022-11-11 DIAGNOSIS — F339 Major depressive disorder, recurrent, unspecified: Secondary | ICD-10-CM

## 2022-11-11 DIAGNOSIS — F419 Anxiety disorder, unspecified: Secondary | ICD-10-CM

## 2022-11-11 DIAGNOSIS — F32A Depression, unspecified: Secondary | ICD-10-CM

## 2022-12-23 ENCOUNTER — Other Ambulatory Visit: Payer: Self-pay | Admitting: Family Medicine

## 2022-12-23 DIAGNOSIS — F419 Anxiety disorder, unspecified: Secondary | ICD-10-CM

## 2022-12-23 DIAGNOSIS — F339 Major depressive disorder, recurrent, unspecified: Secondary | ICD-10-CM

## 2023-01-12 LAB — HM MAMMOGRAPHY: HM Mammogram: NORMAL (ref 0–4)

## 2023-01-12 LAB — HM PAP SMEAR: HM Pap smear: NORMAL

## 2023-01-15 ENCOUNTER — Ambulatory Visit: Payer: BC Managed Care – PPO | Admitting: Family Medicine

## 2023-01-15 ENCOUNTER — Encounter: Payer: Self-pay | Admitting: Family Medicine

## 2023-01-15 VITALS — BP 106/74 | HR 90 | Ht 66.0 in | Wt 182.0 lb

## 2023-01-15 DIAGNOSIS — F419 Anxiety disorder, unspecified: Secondary | ICD-10-CM

## 2023-01-15 DIAGNOSIS — F32A Depression, unspecified: Secondary | ICD-10-CM | POA: Diagnosis not present

## 2023-01-15 DIAGNOSIS — F339 Major depressive disorder, recurrent, unspecified: Secondary | ICD-10-CM

## 2023-01-15 MED ORDER — BUPROPION HCL ER (XL) 150 MG PO TB24: 150.0000 mg | ORAL_TABLET | Freq: Every day | ORAL | 2 refills | Status: DC

## 2023-01-15 MED ORDER — ESCITALOPRAM OXALATE 20 MG PO TABS: 20.0000 mg | ORAL_TABLET | Freq: Every day | ORAL | 1 refills | Status: DC

## 2023-01-15 NOTE — Progress Notes (Signed)
BP 106/74   Pulse 90   Ht 5\' 6"  (1.676 m)   Wt 182 lb (82.6 kg)   SpO2 100%   BMI 29.38 kg/m    Subjective:   Patient ID: Alexa Leon, female    DOB: 1976-03-16, 47 y.o.   MRN: [id number]  HPI: Alexa Leon is a 47 y.o. female presenting on 01/15/2023 for Medical Management of Chronic Issues, Anxiety, and Depression   HPI Anxiety depression recheck Patient is coming in today for anxiety depression recheck.  She has been taking Lexapro and it was working well for her but she said over the past year she started noticing that it is fading a little bit and she is having more feelings of despair creep and hopelessness.  She does still feel like it helped some but not like it used to.  She is also had more thoughts of feeling like she is better off dead but she denies any suicidal thoughts or thoughts of hurting herself or killing herself.  She is coming in today because she wants to discuss possible options to help or transition to something else.  Relevant past medical, surgical, family and social history reviewed and updated as indicated. Interim medical history since our last visit reviewed. Allergies and medications reviewed and updated.  Review of Systems  Constitutional:  Negative for chills and fever.  Eyes:  Negative for visual disturbance.  Respiratory:  Negative for chest tightness and shortness of breath.   Cardiovascular:  Negative for chest pain and leg swelling.  Musculoskeletal:  Negative for back pain and gait problem.  Skin:  Negative for rash.  Neurological:  Negative for light-headedness and headaches.  Psychiatric/Behavioral:  Positive for dysphoric mood. Negative for agitation, behavioral problems, self-injury, sleep disturbance and suicidal ideas. The patient is nervous/anxious.   All other systems reviewed and are negative.   Per HPI unless specifically indicated above   Allergies as of 01/15/2023       Reactions   Amoxicillin Swelling   Swelling lips,  blisters in the throat        Medication List        Accurate as of January 15, 2023  3:55 PM. If you have any questions, ask your nurse or doctor.          benzonatate 100 MG capsule Commonly known as: Tessalon Perles Take 1 capsule (100 mg total) by mouth 3 (three) times daily as needed.   buPROPion 150 MG 24 hr tablet Commonly known as: Wellbutrin XL Take 1 tablet (150 mg total) by mouth daily. Started by: Fransisca Kaufmann Namrata Dangler, MD   escitalopram 20 MG tablet Commonly known as: LEXAPRO Take 1 tablet (20 mg total) by mouth daily. What changed: See the new instructions. Changed by: Fransisca Kaufmann [place], MD   fluticasone 50 MCG/ACT nasal spray Commonly known as: FLONASE Place 1 spray into both nostrils 2 (two) times daily as needed for allergies or rhinitis.   guaiFENesin 600 MG 12 hr tablet Commonly known as: Mucinex Take 1 tablet (600 mg total) by mouth 2 (two) times daily.   hydrOXYzine 25 MG tablet Commonly known as: ATARAX Take 1 tablet (25 mg total) by mouth 3 (three) times daily as needed.   mometasone 50 MCG/ACT nasal spray Commonly known as: Nasonex Place 2 sprays into the nose daily.   Vyvanse 40 MG capsule Generic drug: lisdexamfetamine Take 40 mg by mouth every morning.   lisdexamfetamine 20 MG capsule Commonly known as: VYVANSE Take 20 mg by  mouth every evening.         Objective:   BP 106/74   Pulse 90   Ht 5\' 6"  (1.676 m)   Wt 182 lb (82.6 kg)   SpO2 100%   BMI 29.38 kg/m   Wt Readings from Last 3 Encounters:  01/15/23 182 lb (82.6 kg)  10/31/22 179 lb 3.2 oz (81.3 kg)  10/23/21 166 lb (75.3 kg)    Physical Exam Vitals and nursing note reviewed.  Constitutional:      General: She is not in acute distress.    Appearance: She is well-developed. She is not diaphoretic.  Eyes:     Conjunctiva/sclera: Conjunctivae normal.  Cardiovascular:     Rate and Rhythm: Normal rate and regular rhythm.     Heart sounds: Normal heart sounds.  No murmur heard. Pulmonary:     Effort: Pulmonary effort is normal. No respiratory distress.     Breath sounds: Normal breath sounds. No wheezing.  Musculoskeletal:        General: No tenderness. Normal range of motion.  Skin:    General: Skin is warm and dry.     Findings: No rash.  Neurological:     Mental Status: She is alert and oriented to person, place, and time.     Coordination: Coordination normal.  Psychiatric:        Mood and Affect: Mood is anxious and depressed.        Behavior: Behavior normal.        Thought Content: Thought content does not include suicidal ideation. Thought content does not include suicidal plan.       Assessment & Plan:   Problem List Items Addressed This Visit       Other   Depression, recurrent ([place]) - Primary   Relevant Medications   escitalopram (LEXAPRO) 20 MG tablet   buPROPion (WELLBUTRIN XL) 150 MG 24 hr tablet   Anxiety   Relevant Medications   escitalopram (LEXAPRO) 20 MG tablet   buPROPion (WELLBUTRIN XL) 150 MG 24 hr tablet   Other Visit Diagnoses     Anxiety and depression       Relevant Medications   escitalopram (LEXAPRO) 20 MG tablet   buPROPion (WELLBUTRIN XL) 150 MG 24 hr tablet       Will add Wellbutrin to her Lexapro, continue Lexapro. Follow up plan: Return if symptoms worsen or fail to improve, for 1 to 37-month anxiety depression.  Counseling provided for all of the vaccine components Orders Placed This Encounter  Procedures   HM MAMMOGRAPHY   HM PAP SMEAR    Caryl Pina, MD [facility] 01/15/2023, 3:55 PM

## 2023-02-16 ENCOUNTER — Encounter: Payer: Self-pay | Admitting: Family Medicine

## 2023-02-16 ENCOUNTER — Ambulatory Visit: Payer: BC Managed Care – PPO | Admitting: Family Medicine

## 2023-02-16 DIAGNOSIS — F339 Major depressive disorder, recurrent, unspecified: Secondary | ICD-10-CM | POA: Diagnosis not present

## 2023-02-16 DIAGNOSIS — F419 Anxiety disorder, unspecified: Secondary | ICD-10-CM | POA: Diagnosis not present

## 2023-02-16 MED ORDER — ESCITALOPRAM OXALATE 20 MG PO TABS: 20.0000 mg | ORAL_TABLET | Freq: Every day | ORAL | 1 refills | Status: DC

## 2023-02-16 MED ORDER — BUPROPION HCL ER (XL) 150 MG PO TB24: 150.0000 mg | ORAL_TABLET | Freq: Every day | ORAL | 1 refills | Status: DC

## 2023-02-16 NOTE — Patient Instructions (Signed)
3 to 25-monthanxiety

## 2023-02-16 NOTE — Progress Notes (Signed)
BP 113/77   Pulse 70   Temp 97.6 F (36.4 C) (Oral)   Resp 20   Ht '5\' 6"'$  (1.676 m)   Wt 81.2 kg   SpO2 98%   BMI 28.89 kg/m    Subjective:   Patient ID: Alexa Leon, female    DOB: [birth year], 47 y.o.   MRN: [id number]  HPI: Alexa Leon is a 47 y.o. female presenting on 02/16/2023 for Follow up anxiety and depression  Patient presents today for anxiety and depression recheck. Patient has been taking Lexapro 20 mg PO qd and Bupropion HCl 150 mg PO qd. Patient reports she is still having some feelings of despair, but states the addition of the Bupropion 1 month ago has helped tremendously, especially in the last two weeks. Patient did not report any thoughts of harming herself or others. Patient denies any concerns with sleep or side effects from the medications. Current PHQ 2/9 score is 4. Patient is content with the current medications and would like to continue their use.      02/16/2023    3:31 PM 01/15/2023    3:26 PM 10/31/2022    8:10 AM 10/23/2021    3:43 PM 03/01/2021    3:10 PM  Depression screen PHQ 2/9  Decreased Interest '1 2 1 1 1  '$ Down, Depressed, Hopeless '1 2 1 1 1  '$ PHQ - 2 Score '2 4 2 2 2  '$ Altered sleeping '1 2 1 1 2  '$ Tired, decreased energy 0 '2 1 1 3  '$ Change in appetite 0 2 0 0 0  Feeling bad or failure about yourself  0 2 0 1 1  Trouble concentrating 1 1 0 0 1  Moving slowly or fidgety/restless 0 1 0 0 0  Suicidal thoughts 0 1 0 0 1  PHQ-9 Score '4 15 4 5 10  '$ Difficult doing work/chores Not difficult at all Somewhat difficult Not difficult at all      Relevant past medical, surgical, family and social history were reviewed and updated as indicated. Interim medical history were reviewed. Allergies and medications were reviewed and updated.  Review of Systems  Constitutional:  Negative for chills and fever.  Eyes:  Negative for visual disturbance.  Respiratory:  Negative for chest tightness and shortness of breath.   Cardiovascular:  Negative for chest  pain and leg swelling.  Gastrointestinal:  Negative for abdominal pain.  Musculoskeletal:  Negative for back pain and gait problem.  Neurological:  Negative for light-headedness and headaches.  Psychiatric/Behavioral:  Negative for confusion, decreased concentration, dysphoric mood, self-injury, sleep disturbance and suicidal ideas. The patient is not nervous/anxious.   All other systems reviewed and are negative.   Per HPI unless specifically indicated above.   Allergies as of 02/16/2023       Reactions   Amoxicillin Swelling   Swelling lips, blisters in the throat        Medication List        Accurate as of February 16, 2023  4:13 PM. If you have any questions, ask your nurse or doctor.          benzonatate 100 MG capsule Commonly known as: Tessalon Perles Take 1 capsule (100 mg total) by mouth 3 (three) times daily as needed.   buPROPion 150 MG 24 hr tablet Commonly known as: Wellbutrin XL Take 1 tablet (150 mg total) by mouth daily.   escitalopram 20 MG tablet Commonly known as: LEXAPRO Take 1 tablet (20 mg total) by mouth daily.  fluticasone 50 MCG/ACT nasal spray Commonly known as: FLONASE Place 1 spray into both nostrils 2 (two) times daily as needed for allergies or rhinitis.   guaiFENesin 600 MG 12 hr tablet Commonly known as: Mucinex Take 1 tablet (600 mg total) by mouth 2 (two) times daily.   hydrOXYzine 25 MG tablet Commonly known as: ATARAX Take 1 tablet (25 mg total) by mouth 3 (three) times daily as needed.   mometasone 50 MCG/ACT nasal spray Commonly known as: Nasonex Place 2 sprays into the nose daily.   Vyvanse 40 MG capsule Generic drug: lisdexamfetamine Take 40 mg by mouth every morning.   lisdexamfetamine 20 MG capsule Commonly known as: VYVANSE Take 20 mg by mouth every evening.         Objective:   BP 113/77   Pulse 70   Temp 97.6 F (36.4 C) (Oral)   Resp 20   Ht 5\' 6"  (1.676 m)   Wt 81.2 kg   SpO2 98%   BMI 28.89  kg/m   Wt Readings from Last 3 Encounters:  02/16/23 81.2 kg  01/15/23 82.6 kg  10/31/22 81.3 kg    Physical Exam Vitals and nursing note reviewed.  Constitutional:      General: She is not in acute distress.    Appearance: Normal appearance. She is not diaphoretic.  Eyes:     Conjunctiva/sclera: Conjunctivae normal.  Neck:     Thyroid: No thyromegaly or thyroid tenderness.  Cardiovascular:     Rate and Rhythm: Normal rate and regular rhythm.     Pulses: Normal pulses.     Heart sounds: Normal heart sounds.  Pulmonary:     Effort: Pulmonary effort is normal.  Abdominal:     Tenderness: There is no right CVA tenderness or left CVA tenderness.  Musculoskeletal:     Cervical back: Neck supple. No tenderness.     Right lower leg: No edema.     Left lower leg: No edema.  Lymphadenopathy:     Cervical: No cervical adenopathy.  Skin:    General: Skin is warm and dry.  Neurological:     Mental Status: She is alert and oriented to person, place, and time.     Gait: Gait normal.  Psychiatric:        Attention and Perception: Attention normal.        Mood and Affect: Affect normal. Mood is depressed (occasional feelings of despair). Mood is not anxious.        Behavior: Behavior normal.        Thought Content: Thought content normal. Thought content does not include suicidal ideation. Thought content does not include suicidal plan.       Assessment & Plan:   Problem List Items Addressed This Visit       Other   Depression, recurrent ([place])   Relevant Medications   buPROPion (WELLBUTRIN XL) 150 MG 24 hr tablet   escitalopram (LEXAPRO) 20 MG tablet   Anxiety   Relevant Medications   buPROPion (WELLBUTRIN XL) 150 MG 24 hr tablet   escitalopram (LEXAPRO) 20 MG tablet   Other Visit Diagnoses     Anxiety and depression       Relevant Medications   buPROPion (WELLBUTRIN XL) 150 MG 24 hr tablet   escitalopram (LEXAPRO) 20 MG tablet       Patient instructed to continue  current Bupropion and Lexapro, and notify preceptor if there are any changes in mood/increases in anxiety and depression.  Follow up plan: Return if symptoms worsen or fail to improve, for 3 to 3-month anxiety depression.  Counseling provided for all of the vaccine components No orders of the defined types were placed in this encounter.   Summer [place], PA-S2 [facility] 02/16/2023, 4:13 PM  Patient seen and examined with PA student, agree with assessment and plan above.  It does seem like patient is doing very well on current medicine.  She still does have a lot of stressors in life but she seems to be handling them well.  Did discuss possibly for counseling and she will consider it. Vonna Kotyk Viren Lebeau [facility] 02/16/2023, 4:13 PM

## 2023-03-30 ENCOUNTER — Encounter: Payer: Self-pay | Admitting: Family Medicine

## 2023-04-01 ENCOUNTER — Ambulatory Visit: Payer: BC Managed Care – PPO | Admitting: Family Medicine

## 2023-04-01 ENCOUNTER — Encounter: Payer: Self-pay | Admitting: Family Medicine

## 2023-04-01 VITALS — BP 99/69 | HR 95 | Temp 96.9°F | Ht 66.0 in | Wt 177.6 lb

## 2023-04-01 DIAGNOSIS — N946 Dysmenorrhea, unspecified: Secondary | ICD-10-CM | POA: Diagnosis not present

## 2023-04-01 MED ORDER — TRAMADOL HCL 50 MG PO TABS: 50.0000 mg | ORAL_TABLET | Freq: Four times a day (QID) | ORAL | 0 refills | Status: AC

## 2023-04-01 MED ORDER — LEVONORGEST-ETH ESTRAD 91-DAY 0.15-0.03 &0.01 MG PO TABS: 1.0000 | ORAL_TABLET | Freq: Every day | ORAL | 4 refills | Status: DC

## 2023-04-01 NOTE — Progress Notes (Signed)
Subjective:  Patient ID: Alexa Leon, female    DOB: 12/16/1975  Age: 47 y.o. MRN: [id number]  CC: Menstrual Problem (INTENSE CRAMPS)   HPI [name] presents for years of severe menstual cramps. Had a D&C last year. For several months it has been gradually getting ack to severe. Pain interfering with lifestyle. No relief with multiple OTCs. Tried hdrocodone with relief last month. (Borrowed.)  Menses coming q 3 weeks lasting 7-8 days. This one started 3 days ago. Days 2-5 severe for cramping and bleeding. Severity of pain 9/10. Goes across abd and down to the right knee. Sexually active. Husband had vasectomy. Denies DC between menses. No dysuria. This is typical for her      04/01/2023    9:23 AM 02/16/2023    3:31 PM 01/15/2023    3:26 PM  Depression screen PHQ 2/9  Decreased Interest Down, Depressed, Hopeless PHQ - 2 Score Altered sleeping Tired, decreased energy 1 0 2  Change in appetite 0 0 2  Feeling bad or failure about yourself  1 0 2  Trouble concentrating Moving slowly or fidgety/restless 0 0 1  Suicidal thoughts 0 0 1  PHQ-9 Score Difficult doing work/chores Somewhat difficult Not difficult at all Somewhat difficult    History Manuel has a past medical history of Anxiety and Depression.   She has a past surgical history that includes Finger surgery (Right).   Her family history includes Anxiety disorder in her maternal grandmother, mother, and paternal grandmother; Cancer in her maternal aunt.She reports that she has never smoked. She has never used smokeless tobacco. She reports current alcohol use. She reports that she does not use drugs.    ROS Review of Systems  Constitutional: Negative.   HENT: Negative.    Eyes:  Negative for visual disturbance.  Respiratory:  Negative for shortness of breath.   Cardiovascular:  Negative for chest pain.  Gastrointestinal:  Negative for abdominal pain.  Musculoskeletal:   Negative for arthralgias.    Objective:  BP 99/69   Pulse 95   Temp (!) 96.9 F (36.1 C)   Ht  (1.676 m)   Wt 177 lb 9.6 oz (80.6 kg)   SpO2 100%   BMI 28.67 kg/m   BP Readings from Last 3 Encounters:  04/01/23 99/69  02/16/23 113/77  01/15/23 106/74    Wt Readings from Last 3 Encounters:  04/01/23 177 lb 9.6 oz (80.6 kg)  02/16/23 179 lb (81.2 kg)  01/15/23 182 lb (82.6 kg)     Physical Exam Constitutional:      General: She is not in acute distress.    Appearance: She is well-developed.  Cardiovascular:     Rate and Rhythm: Normal rate and regular rhythm.  Pulmonary:     Breath sounds: Normal breath sounds.  Musculoskeletal:        General: Normal range of motion.  Skin:    General: Skin is warm and dry.  Neurological:     Mental Status: She is alert and oriented to person, place, and time.       Assessment & Plan:   [name] was seen today for menstrual problem.  Diagnoses and all orders for this visit:  Severe dysmenorrhea  Other orders -     Levonorgestrel-Ethinyl Estradiol (AMETHIA) 0.15-0.03 &0.01 MG tablet; Take 1 tablet by mouth daily. -  traMADol (ULTRAM) 50 MG tablet; Take 1 tablet (50 mg total) by mouth 4 (four) times daily for 5 days. 1-2 tablets up to 4 times a day as needed for pain       I have discontinued Darnella Ferriss's fluticasone, guaiFENesin, and benzonatate. I am also having her start on Levonorgestrel-Ethinyl Estradiol and traMADol. Additionally, I am having her maintain her hydrOXYzine, Vyvanse, lisdexamfetamine, mometasone, buPROPion, and escitalopram.  Allergies as of 04/01/2023       Reactions   Amoxicillin Swelling   Swelling lips, blisters in the throat        Medication List        Accurate as of April 01, 2023 11:59 PM. If you have any questions, ask your nurse or doctor.          STOP taking these medications    benzonatate 100 MG capsule Commonly known as: Lawyer Stopped by:  Mechele Claude, MD   fluticasone 50 MCG/ACT nasal spray Commonly known as: FLONASE Stopped by: Mechele Claude, MD   guaiFENesin 600 MG 12 hr tablet Commonly known as: Mucinex Stopped by: Mechele Claude, MD       TAKE these medications    buPROPion 150 MG 24 hr tablet Commonly known as: Wellbutrin XL Take 1 tablet (150 mg total) by mouth daily.   escitalopram 20 MG tablet Commonly known as: LEXAPRO Take 1 tablet (20 mg total) by mouth daily.   hydrOXYzine 25 MG tablet Commonly known as: ATARAX Take 1 tablet (25 mg total) by mouth 3 (three) times daily as needed.   Levonorgestrel-Ethinyl Estradiol 0.15-0.03 &0.01 MG tablet Commonly known as: AMETHIA Take 1 tablet by mouth daily. Started by: Mechele Claude, MD   mometasone 50 MCG/ACT nasal spray Commonly known as: Nasonex Place 2 sprays into the nose daily.   traMADol 50 MG tablet Commonly known as: ULTRAM Take 1 tablet (50 mg total) by mouth 4 (four) times daily for 5 days. 1-2 tablets up to 4 times a day as needed for pain Started by: Mechele Claude, MD   Vyvanse 40 MG capsule Generic drug: lisdexamfetamine Take 40 mg by mouth every morning.   lisdexamfetamine 20 MG capsule Commonly known as: VYVANSE Take 20 mg by mouth every evening.         Follow-up: Return in about 6 weeks (around 05/13/2023).  Mechele Claude, M.D.

## 2023-04-03 ENCOUNTER — Encounter: Payer: Self-pay | Admitting: Family Medicine

## 2023-05-14 ENCOUNTER — Ambulatory Visit: Payer: BC Managed Care – PPO | Admitting: Family Medicine

## 2023-05-18 ENCOUNTER — Other Ambulatory Visit: Payer: Self-pay | Admitting: Family Medicine

## 2023-05-28 ENCOUNTER — Encounter: Payer: Self-pay | Admitting: Family Medicine

## 2023-05-28 ENCOUNTER — Ambulatory Visit: Payer: BC Managed Care – PPO | Admitting: Family Medicine

## 2023-05-28 VITALS — BP 115/79 | HR 93 | Ht 66.0 in | Wt 179.0 lb

## 2023-05-28 DIAGNOSIS — N946 Dysmenorrhea, unspecified: Secondary | ICD-10-CM | POA: Diagnosis not present

## 2023-05-28 DIAGNOSIS — F339 Major depressive disorder, recurrent, unspecified: Secondary | ICD-10-CM | POA: Diagnosis not present

## 2023-05-28 DIAGNOSIS — Z1211 Encounter for screening for malignant neoplasm of colon: Secondary | ICD-10-CM

## 2023-05-28 DIAGNOSIS — F419 Anxiety disorder, unspecified: Secondary | ICD-10-CM | POA: Diagnosis not present

## 2023-05-28 MED ORDER — ETONOGESTREL-ETHINYL ESTRADIOL 0.12-0.015 MG/24HR VA RING: VAGINAL_RING | VAGINAL | 4 refills | Status: DC

## 2023-05-28 NOTE — Progress Notes (Signed)
BP 115/79   Pulse 93   Ht 5\' 6"  (1.676 m)   Wt 179 lb (81.2 kg)   SpO2 100%   BMI 28.89 kg/m    Subjective:   Patient ID: Alexa Leon, female    DOB: 1976/10/23, 47 y.o.   MRN: [id number]  HPI: Alexa Leon is a 47 y.o. female presenting on 05/28/2023 for Medical Management of Chronic Issues, Anxiety, Depression, and Metrorrhagia (Improved on OCP)   HPI Anxiety depression recheck Patient is coming in today for anxiety depression recheck.  She is currently taking Wellbutrin and Lexapro and uses hydroxyzine as needed she feels like he is doing very well for their, the addition of Wellbutrin has done very well for her and she denies any suicidal ideations or thoughts of hurting self.    05/28/2023    3:37 PM 04/01/2023    9:23 AM 02/16/2023    3:31 PM 01/15/2023    3:26 PM 10/31/2022    8:10 AM  Depression screen PHQ 2/9  Decreased Interest 0 1 1 2 1   Down, Depressed, Hopeless 1 1 1 2 1   PHQ - 2 Score 1 2 2 4 2   Altered sleeping 1 1 1 2 1   Tired, decreased energy 1 1 0 2 1  Change in appetite 0 0 0 2 0  Feeling bad or failure about yourself  0 1 0 2 0  Trouble concentrating 0 1 1 1  0  Moving slowly or fidgety/restless 0 0 0 1 0  Suicidal thoughts 0 0 0 1 0  PHQ-9 Score 3 6 4 15 4   Difficult doing work/chores Not difficult at all Somewhat difficult Not difficult at all Somewhat difficult Not difficult at all     Intermenstrual bleeding and dysmenorrhea Patient is still having intermenstrual bleeding although the dysmenorrhea is much improved and she is not having cramping and pain.  She is on the oral 27-month cycle birth control and she feels like it has helped suppress the cramping part of her cycles but she is basically spotting every day and she is happy year but obviously spotting every day still a problem.  She denies any side effects from the birth control.  Relevant past medical, surgical, family and social history reviewed and updated as indicated. Interim medical  history since our last visit reviewed. Allergies and medications reviewed and updated.  Review of Systems  Constitutional:  Negative for chills and fever.  Eyes:  Negative for redness and visual disturbance.  Respiratory:  Negative for chest tightness and shortness of breath.   Cardiovascular:  Negative for chest pain and leg swelling.  Gastrointestinal:  Negative for abdominal pain.  Genitourinary:  Positive for menstrual problem and vaginal bleeding. Negative for vaginal discharge and vaginal pain.  Musculoskeletal:  Negative for back pain and gait problem.  Skin:  Negative for rash.  Neurological:  Negative for light-headedness and headaches.  Psychiatric/Behavioral:  Negative for agitation and behavioral problems.   All other systems reviewed and are negative.   Per HPI unless specifically indicated above   Allergies as of 05/28/2023       Reactions   Amoxicillin Swelling   Swelling lips, blisters in the throat        Medication List        Accurate as of May 28, 2023  3:55 PM. If you have any questions, ask your nurse or doctor.          STOP taking these medications    Levonorgestrel-Ethinyl Estradiol  0.15-0.03 &0.01 MG tablet Commonly known as: AMETHIA Stopped by: Elige Radon Yaire Kreher, MD   mometasone 50 MCG/ACT nasal spray Commonly known as: Nasonex Stopped by: Nils Pyle, MD       TAKE these medications    buPROPion 150 MG 24 hr tablet Commonly known as: Wellbutrin XL Take 1 tablet (150 mg total) by mouth daily.   escitalopram 20 MG tablet Commonly known as: LEXAPRO Take 1 tablet (20 mg total) by mouth daily.   etonogestrel-ethinyl estradiol 0.12-0.015 MG/24HR vaginal ring Commonly known as: NuvaRing Insert vaginally and leave in place for 3 consecutive weeks, reinsert a new 1 immediately and repeat for 3 cycles and then remove for 1 week after every 3 cycles Started by: Nils Pyle, MD   hydrOXYzine 25 MG tablet Commonly known  as: ATARAX Take 1 tablet (25 mg total) by mouth 3 (three) times daily as needed.   Vyvanse 40 MG capsule Generic drug: lisdexamfetamine Take 40 mg by mouth every morning.   lisdexamfetamine 20 MG capsule Commonly known as: VYVANSE Take 20 mg by mouth every evening.         Objective:   BP 115/79   Pulse 93   Ht 5\' 6"  (1.676 m)   Wt 179 lb (81.2 kg)   SpO2 100%   BMI 28.89 kg/m   Wt Readings from Last 3 Encounters:  05/28/23 179 lb (81.2 kg)  04/01/23 177 lb 9.6 oz (80.6 kg)  02/16/23 179 lb (81.2 kg)    Physical Exam Vitals and nursing note reviewed.  Constitutional:      General: She is not in acute distress.    Appearance: She is well-developed. She is not diaphoretic.  Eyes:     Conjunctiva/sclera: Conjunctivae normal.  Cardiovascular:     Rate and Rhythm: Normal rate and regular rhythm.     Heart sounds: Normal heart sounds. No murmur heard. Pulmonary:     Effort: Pulmonary effort is normal. No respiratory distress.     Breath sounds: Normal breath sounds. No wheezing.  Abdominal:     General: Abdomen is flat. Bowel sounds are normal. There is no distension.     Palpations: Abdomen is soft.     Tenderness: There is abdominal tenderness in the suprapubic area. There is no guarding or rebound.  Musculoskeletal:        General: No tenderness. Normal range of motion.  Skin:    General: Skin is warm and dry.     Findings: No rash.  Neurological:     Mental Status: She is alert and oriented to person, place, and time.     Coordination: Coordination normal.  Psychiatric:        Behavior: Behavior normal.       Assessment & Plan:   Problem List Items Addressed This Visit       Other   Depression, recurrent ([facility])   Relevant Orders   CBC with Differential/Platelet   Anxiety - Primary   Relevant Orders   CBC with Differential/Platelet   Other Visit Diagnoses     Severe dysmenorrhea       Relevant Medications   etonogestrel-ethinyl estradiol  (NUVARING) 0.12-0.015 MG/24HR vaginal ring   Other Relevant Orders   CBC with Differential/Platelet   Colon cancer screening       Relevant Orders   Ambulatory referral to Gastroenterology       We are going to try the NuvaRing where she does not take a week break except every 3  months and see if that does better than the oral  Continue home medicine for anxiety depression. Follow up plan: Return in about 3 months (around 08/28/2023), or if symptoms worsen or fail to improve, for Anxiety depression and dysmenorrhea.  Counseling provided for all of the vaccine components Orders Placed This Encounter  Procedures   CBC with Differential/Platelet   Ambulatory referral to Gastroenterology    Arville Care, MD [facility] 05/28/2023, 3:55 PM

## 2023-05-29 LAB — CBC WITH DIFFERENTIAL/PLATELET
Basophils Absolute: 0.1 10*3/uL (ref 0.0–0.2)
Basos: 1 %
EOS (ABSOLUTE): 0.1 10*3/uL (ref 0.0–0.4)
Eos: 1 %
Hematocrit: 38.2 % (ref 34.0–46.6)
Hemoglobin: 12.3 g/dL (ref 11.1–15.9)
Immature Grans (Abs): 0 10*3/uL (ref 0.0–0.1)
Immature Granulocytes: 0 %
Lymphocytes Absolute: 2.8 10*3/uL (ref 0.7–3.1)
Lymphs: 31 %
MCH: 26.9 pg (ref 26.6–33.0)
MCHC: 32.2 g/dL (ref 31.5–35.7)
MCV: 84 fL (ref 79–97)
Monocytes Absolute: 0.5 10*3/uL (ref 0.1–0.9)
Monocytes: 6 %
Neutrophils Absolute: 5.5 10*3/uL (ref 1.4–7.0)
Neutrophils: 61 %
Platelets: 333 10*3/uL (ref 150–450)
RBC: 4.57 x10E6/uL (ref 3.77–5.28)
RDW: 13.9 % (ref 11.7–15.4)
WBC: 9 10*3/uL (ref 3.4–10.8)

## 2023-06-09 ENCOUNTER — Encounter (INDEPENDENT_AMBULATORY_CARE_PROVIDER_SITE_OTHER): Payer: Self-pay | Admitting: *Deleted

## 2023-06-11 ENCOUNTER — Other Ambulatory Visit: Payer: Self-pay | Admitting: Family Medicine

## 2023-08-28 ENCOUNTER — Ambulatory Visit: Payer: BC Managed Care – PPO | Admitting: Family Medicine

## 2023-10-13 ENCOUNTER — Encounter (HOSPITAL_BASED_OUTPATIENT_CLINIC_OR_DEPARTMENT_OTHER): Payer: Self-pay | Admitting: Obstetrics & Gynecology

## 2023-10-13 NOTE — Progress Notes (Signed)
Spoke w/ via phone for pre-op interview--- pt Lab needs dos---- urine preg        Lab results------ pt has pat lab appt 10/ 30 at 1445 for CBC/ CMP/ T&S COVID test -----patient states asymptomatic no test needed Arrive at ------- 0530 on 10-19-2023 NPO after MN NO Solid Food.  Clear liquids from MN until--- 0430 Med rec completed Medications to take morning of surgery ----- wellbutrin/ lexapro Diabetic medication ----- n/a Patient instructed no nail polish to be worn day of surgery Patient instructed to bring photo id and insurance card day of surgery Patient aware to have Driver (ride ) / caregiver    for 24 hours after surgery - husband, zachary Patient Special Instructions ----- reviewed [facility]/ visitor guidelines.  Pt will pick up bag w/ hibiclens soap and written instructions for dos Pre-Op special Instructions ----- n/a Patient verbalized understanding of instructions that were given at this phone interview. Patient denies chest pain, sob, fever, cough at the interview.

## 2023-10-13 NOTE — Progress Notes (Signed)
Your procedure is scheduled on [place],  10-19-2023  Report to [facility] AT  _5:30__ AM.   Call this number if you have problems the morning of surgery  [phone].   OUR ADDRESS IS [address].  WE ARE LOCATED IN [facility].  PLEASE BRING YOUR INSURANCE CARD AND PHOTO ID DAY OF SURGERY.  ONLY 2 PEOPLE ARE ALLOWED IN  WAITING  ROOM                                      REMEMBER:  DO NOT EAT FOOD, CANDY GUM OR MINTS  AFTER MIDNIGHT THE NIGHT BEFORE YOUR SURGERY . YOU MAY HAVE CLEAR LIQUIDS FROM MIDNIGHT THE NIGHT BEFORE YOUR SURGERY UNTIL  _4:30 AM. NO CLEAR LIQUIDS AFTER   4:30 AM_ DAY OF SURGERY.  YOU MAY  BRUSH YOUR TEETH MORNING OF SURGERY AND RINSE YOUR MOUTH OUT, NO CHEWING GUM CANDY OR MINTS.     CLEAR LIQUID DIET  Allowed      Water                                                                   Coffee and tea, regular and decaf  (NO cream or milk products of any type, may sweeten)                         Carbonated beverages, regular and diet                                    Sports drinks like Gatorade _____________________________________________________________________     TAKE ONLY THESE MEDICATIONS MORNING OF SURGERY:  Bupropion (wellbutrin),  Escitalopram (lexapro)                                        DO NOT WEAR JEWERLY/  METAL/  PIERCINGS (INCLUDING NO PLASTIC PIERCINGS) DO NOT WEAR LOTIONS, POWDERS, PERFUMES OR NAIL POLISH ON YOUR FINGERNAILS. TOENAIL POLISH IS OK TO WEAR. DO NOT SHAVE FOR 48 HOURS PRIOR TO DAY OF SURGERY.  CONTACTS, GLASSES, OR DENTURES MAY NOT BE WORN TO SURGERY.  REMEMBER: NO SMOKING, VAPING ,  DRUGS OR ALCOHOL FOR 24 HOURS BEFORE YOUR SURGERY.                                    [facility] IS NOT RESPONSIBLE  FOR ANY BELONGINGS.                                                                    Alexa Leon Kitchen           Alexa Leon - Preparing  for Surgery Before surgery, you can play an  important role.  Because skin is not sterile, your skin needs to be as free of germs as possible.  You can reduce the number of germs on your skin by washing with CHG (chlorahexidine gluconate) soap before surgery.  CHG is an antiseptic cleaner which kills germs and bonds with the skin to continue killing germs even after washing. Please DO NOT use if you have an allergy to CHG or antibacterial soaps.  If your skin becomes reddened/irritated stop using the CHG and inform your nurse when you arrive at Short Stay. Do not shave (including legs and underarms) for at least 48 hours prior to the first CHG shower.  You may shave your face/neck. Please follow these instructions carefully:  1.  Shower with CHG Soap the night before surgery and the  morning of Surgery.  2.  If you choose to wash your hair, wash your hair first as usual with your  normal  shampoo.  3.  After you shampoo, rinse your hair and body thoroughly to remove the  shampoo.                                        4.  Use CHG as you would any other liquid soap.  You can apply chg directly  to the skin and wash , chg soap provided, night before and morning of your surgery.  5.  Apply the CHG Soap to your body ONLY FROM THE NECK DOWN.   Do not use on face/ open                           Wound or open sores. Avoid contact with eyes, ears mouth and genitals (private parts).                       Wash face,  Genitals (private parts) with your normal soap.             6.  Wash thoroughly, paying special attention to the area where your surgery  will be performed.  7.  Thoroughly rinse your body with warm water from the neck down.  8.  DO NOT shower/wash with your normal soap after using and rinsing off  the CHG Soap.             9.  Pat yourself dry with a clean towel.            10.  Wear clean pajamas.            11.  Place clean sheets on your bed the night of your first shower and do not  sleep with pets. Day of Surgery : Do not apply any  lotions/ powders the morning of surgery.  Please wear clean clothes to the hospital/surgery center.  IF YOU HAVE ANY SKIN IRRITATION OR PROBLEMS WITH THE SURGICAL SOAP, PLEASE GET A BAR OF GOLD DIAL SOAP AND SHOWER THE NIGHT BEFORE YOUR SURGERY AND THE MORNING OF YOUR SURGERY. PLEASE LET THE NURSE KNOW MORNING OF YOUR SURGERY IF YOU HAD ANY PROBLEMS WITH THE SURGICAL SOAP.   YOUR SURGEON MAY HAVE REQUESTED EXTENDED RECOVERY TIME AFTER YOUR SURGERY. IT COULD BE A  JUST A FEW HOURS  UP TO AN OVERNIGHT STAY.  YOUR SURGEON SHOULD HAVE DISCUSSED THIS WITH YOU  PRIOR TO YOUR SURGERY. IN THE EVENT YOU NEED TO STAY OVERNIGHT PLEASE REFER TO THE FOLLOWING GUIDELINES. YOU MAY HAVE UP TO 4 VISITORS  MAY VISIT IN THE EXTENDED RECOVERY ROOM UNTIL 800 PM ONLY.  ONE  VISITOR AGE 24 AND OVER MAY SPEND THE NIGHT AND MUST BE IN EXTENDED RECOVERY ROOM NO LATER THAN 800 PM . YOUR DISCHARGE TIME AFTER YOU SPEND THE NIGHT IS 900 AM THE MORNING AFTER YOUR SURGERY. YOU MAY PACK A SMALL OVERNIGHT BAG WITH TOILETRIES FOR YOUR OVERNIGHT STAY IF YOU WISH.  REGARDLESS OF IF YOU STAY OVER NIGHT OR ARE DISCHARGED THE SAME DAY YOU WILL BE REQUIRED TO HAVE A RESPONSIBLE ADULT (18 YRS OLD OR OLDER) STAY WITH YOU FOR AT LEAST THE FIRST 24 HOURS  YOUR PRESCRIPTION MEDICATIONS WILL BE PROVIDED DURING YOUR HOSPITAL STAY.  ________________________________________________________________________                                                        QUESTIONS Alexa Leon PRE OP NURSE PHONE [phone].

## 2023-10-14 ENCOUNTER — Encounter (HOSPITAL_COMMUNITY)
Admission: RE | Admit: 2023-10-14 | Discharge: 2023-10-14 | Disposition: A | Discharge status: Home / Self Care | Payer: BC Managed Care – PPO | Source: Ambulatory Visit | Attending: Obstetrics & Gynecology | Admitting: Obstetrics & Gynecology

## 2023-10-14 DIAGNOSIS — N924 Excessive bleeding in the premenopausal period: Secondary | ICD-10-CM | POA: Diagnosis not present

## 2023-10-14 DIAGNOSIS — Z01812 Encounter for preprocedural laboratory examination: Secondary | ICD-10-CM | POA: Insufficient documentation

## 2023-10-14 LAB — CBC
HCT: 39 % (ref 36.0–46.0)
Hemoglobin: 12.2 g/dL (ref 12.0–15.0)
MCH: 27.2 pg (ref 26.0–34.0)
MCHC: 31.3 g/dL (ref 30.0–36.0)
MCV: 87.1 fL (ref 80.0–100.0)
Platelets: 274 10*3/uL (ref 150–400)
RBC: 4.48 MIL/uL (ref 3.87–5.11)
RDW: 13.2 % (ref 11.5–15.5)
WBC: 8.3 10*3/uL (ref 4.0–10.5)
nRBC: 0 % (ref 0.0–0.2)

## 2023-10-14 LAB — COMPREHENSIVE METABOLIC PANEL
ALT: 15 U/L (ref 0–44)
AST: 20 U/L (ref 15–41)
Albumin: 4 g/dL (ref 3.5–5.0)
Alkaline Phosphatase: 56 U/L (ref 38–126)
Anion gap: 8 (ref 5–15)
BUN: 15 mg/dL (ref 6–20)
CO2: 23 mmol/L (ref 22–32)
Calcium: 9.3 mg/dL (ref 8.9–10.3)
Chloride: 109 mmol/L (ref 98–111)
Creatinine, Ser: 0.84 mg/dL (ref 0.44–1.00)
GFR, Estimated: >60 mL/min (ref >60)
Glucose, Bld: 91 mg/dL (ref 70–99)
Potassium: 4.1 mmol/L (ref 3.5–5.1)
Sodium: 140 mmol/L (ref 135–145)
Total Bilirubin: 0.7 mg/dL (ref 0.3–1.2)
Total Protein: 7.3 g/dL (ref 6.5–8.1)

## 2023-10-14 NOTE — H&P (Signed)
Alexa Leon is an 47 y.o. G2P2 female with dysmenorrhea and HMB. Patient has tried OCPs and this has not corrected her c/o. VB is so heavy it affects her QOL.  She often has to change her clothes multiple times a day during the heaviest flow.  Partner s/p vasectomy.  U/S 10/3 showed 163 cc uterine volume with 12.4 mm EMS. 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 Right simple ovarian cyst 26 x 24 mm. Normal left ovary. No FF.  Patient had D&C 06/2022 with benign polyp.  Patient is ready for definitive management.  Preop hgb 12.2.  S/P SVD x 2.  Pertinent Gynecological History: Menses: flow is excessive with use of 8 pads or tampons on heaviest days Bleeding: regular Contraception: vasectomy DES exposure: unknown Blood transfusions: none Sexually transmitted diseases: no past history Previous GYN Procedures: DNC  Last mammogram: normal Date: 01/12/23 Last pap: normal Date: 01/12/23 OB History: G2, P2   Menstrual History: Menarche age: n/a Patient's last menstrual period was 09/16/2023 (approximate).    Past Medical History:  Diagnosis Date   ADHD (attention deficit hyperactivity disorder)    Anxiety    Depression    Dysmenorrhea    Heavy menstrual bleeding     Past Surgical History:  Procedure Laterality Date   FINGER SURGERY Right 2011   orif right middle   HYSTEROSCOPY WITH D & C  07/02/2022   dr Renaldo Fiddler    Family History  Problem Relation Age of Onset   Anxiety disorder Mother    Cancer Maternal Aunt        breast   Anxiety disorder Maternal Grandmother    Anxiety disorder Paternal Grandmother     Social History:  reports that she has never smoked. She has never used smokeless tobacco. She reports current alcohol use. She reports that she does not use drugs.  Allergies:  Allergies  Allergen Reactions   Amoxicillin Anaphylaxis and Swelling    Swelling lips, blisters in the throat    No medications prior to admission.    Review of Systems  Height 5\' 6"  (1.676 m),  weight 77.1 kg, last menstrual period 09/16/2023. Physical Exam Constitutional:      Appearance: Normal appearance.  HENT:     Head: Normocephalic and atraumatic.  Pulmonary:     Effort: Pulmonary effort is normal.  Abdominal:     Palpations: Abdomen is soft.  Musculoskeletal:        General: Normal range of motion.     Cervical back: Normal range of motion.  Skin:    General: Skin is warm and dry.  Neurological:     Mental Status: She is alert and oriented to person, place, and time.  Psychiatric:        Mood and Affect: Mood normal.        Behavior: Behavior normal.     Results for orders placed or performed during the hospital encounter of 10/14/23 (from the past 24 hour(s))  CBC     Status: None   Collection Time: 10/14/23  2:50 PM  Result Value Ref Range   WBC 8.3 4.0 - 10.5 K/uL   RBC 4.48 3.87 - 5.11 MIL/uL   Hemoglobin 12.2 12.0 - 15.0 g/dL   HCT 16.1 09.6 - 04.5 %   MCV 87.1 80.0 - 100.0 fL   MCH 27.2 26.0 - 34.0 pg   MCHC 31.3 30.0 - 36.0 g/dL   RDW 40.9 81.1 - 91.4 %   Platelets 274 150 - 400  K/uL   nRBC 0.0 0.0 - 0.2 %    No results found.  Assessment/Plan: 47 yo G2P2 with HMB and dysmenorrhea Plan LAVH, BS.  She is informed of risk of bleeding, infection, scarring and damage to surrounding structures. She is informed of steps of procedure as well as postop expectations and limitations. All questions were answered and patient wishes to proceed.  Mitchel Honour 10/14/2023, 4:02 PM

## 2023-10-18 MED ORDER — CLINDAMYCIN PHOSPHATE 900 MG/50ML IV SOLN
900.0000 mg | INTRAVENOUS | Status: AC
  Administered 2023-10-19: 900 mg via INTRAVENOUS

## 2023-10-18 MED ORDER — GENTAMICIN SULFATE 40 MG/ML IJ SOLN
5.0000 mg/kg | INTRAVENOUS | Status: AC
  Administered 2023-10-19: 330 mg via INTRAVENOUS
  Filled 2023-10-18: qty 8.25

## 2023-10-19 ENCOUNTER — Encounter (HOSPITAL_BASED_OUTPATIENT_CLINIC_OR_DEPARTMENT_OTHER)
Admission: RE | Disposition: A | Discharge status: Home / Self Care | Payer: Self-pay | Source: Home / Self Care | Attending: Obstetrics & Gynecology

## 2023-10-19 ENCOUNTER — Other Ambulatory Visit: Payer: Self-pay

## 2023-10-19 ENCOUNTER — Ambulatory Visit (HOSPITAL_BASED_OUTPATIENT_CLINIC_OR_DEPARTMENT_OTHER): Payer: BC Managed Care – PPO | Admitting: Certified Registered"

## 2023-10-19 ENCOUNTER — Encounter (HOSPITAL_BASED_OUTPATIENT_CLINIC_OR_DEPARTMENT_OTHER): Payer: Self-pay | Admitting: Obstetrics & Gynecology

## 2023-10-19 ENCOUNTER — Observation Stay (HOSPITAL_BASED_OUTPATIENT_CLINIC_OR_DEPARTMENT_OTHER)
Admission: RE | Admit: 2023-10-19 | Discharge: 2023-10-20 | Disposition: A | Discharge status: Home / Self Care | Payer: BC Managed Care – PPO | Attending: Obstetrics & Gynecology | Admitting: Obstetrics & Gynecology

## 2023-10-19 DIAGNOSIS — N924 Excessive bleeding in the premenopausal period: Secondary | ICD-10-CM

## 2023-10-19 DIAGNOSIS — N946 Dysmenorrhea, unspecified: Secondary | ICD-10-CM | POA: Insufficient documentation

## 2023-10-19 DIAGNOSIS — Z01818 Encounter for other preprocedural examination: Secondary | ICD-10-CM

## 2023-10-19 DIAGNOSIS — Z90710 Acquired absence of both cervix and uterus: Principal | ICD-10-CM | POA: Diagnosis present

## 2023-10-19 DIAGNOSIS — N920 Excessive and frequent menstruation with regular cycle: Principal | ICD-10-CM | POA: Diagnosis present

## 2023-10-19 HISTORY — DX: Attention-deficit hyperactivity disorder, unspecified type: F90.9

## 2023-10-19 HISTORY — PX: LAPAROSCOPIC VAGINAL HYSTERECTOMY WITH SALPINGECTOMY: SHX6680

## 2023-10-19 HISTORY — DX: Excessive and frequent menstruation with regular cycle: N92.0

## 2023-10-19 HISTORY — DX: Dysmenorrhea, unspecified: N94.6

## 2023-10-19 LAB — TYPE AND SCREEN
ABO/RH(D): B NEG
Antibody Screen: NEGATIVE

## 2023-10-19 LAB — ABO/RH: ABO/RH(D): B NEG

## 2023-10-19 LAB — POCT PREGNANCY, URINE: Preg Test, Ur: NEGATIVE

## 2023-10-19 SURGERY — HYSTERECTOMY, VAGINAL, LAPAROSCOPY-ASSISTED, WITH SALPINGECTOMY
Anesthesia: General | Site: Vagina

## 2023-10-19 MED ORDER — DEXAMETHASONE SODIUM PHOSPHATE 10 MG/ML IJ SOLN
INTRAMUSCULAR | Status: AC
  Filled 2023-10-19: qty 1

## 2023-10-19 MED ORDER — ROCURONIUM BROMIDE 10 MG/ML (PF) SYRINGE
PREFILLED_SYRINGE | INTRAVENOUS | Status: DC | PRN
  Administered 2023-10-19: 60 mg via INTRAVENOUS
  Administered 2023-10-19 (×3): 10 mg via INTRAVENOUS

## 2023-10-19 MED ORDER — LIDOCAINE HCL (PF) 2 % IJ SOLN
INTRAMUSCULAR | Status: AC
  Filled 2023-10-19: qty 5

## 2023-10-19 MED ORDER — DEXMEDETOMIDINE HCL IN NACL 80 MCG/20ML IV SOLN
INTRAVENOUS | Status: DC | PRN
  Administered 2023-10-19 (×5): 4 ug via INTRAVENOUS

## 2023-10-19 MED ORDER — LACTATED RINGERS IV SOLN: INTRAVENOUS | Status: DC

## 2023-10-19 MED ORDER — SIMETHICONE 80 MG PO CHEW: 80.0000 mg | CHEWABLE_TABLET | Freq: Four times a day (QID) | ORAL | Status: DC | PRN

## 2023-10-19 MED ORDER — ONDANSETRON HCL 4 MG PO TABS: 4.0000 mg | ORAL_TABLET | Freq: Four times a day (QID) | ORAL | Status: DC | PRN

## 2023-10-19 MED ORDER — HYDROMORPHONE HCL 1 MG/ML IJ SOLN: 0.2000 mg | INTRAMUSCULAR | Status: DC | PRN

## 2023-10-19 MED ORDER — OXYCODONE HCL 5 MG PO TABS
5.0000 mg | ORAL_TABLET | ORAL | Status: DC | PRN
  Administered 2023-10-19: 5 mg via ORAL
  Administered 2023-10-19 (×2): 10 mg via ORAL
  Administered 2023-10-19: 5 mg via ORAL
  Administered 2023-10-20 (×2): 10 mg via ORAL

## 2023-10-19 MED ORDER — OXYCODONE HCL 5 MG/5ML PO SOLN: 5.0000 mg | Freq: Once | ORAL | Status: DC | PRN

## 2023-10-19 MED ORDER — LISDEXAMFETAMINE DIMESYLATE 20 MG PO CAPS: 40.0000 mg | ORAL_CAPSULE | ORAL | Status: DC

## 2023-10-19 MED ORDER — SUGAMMADEX SODIUM 200 MG/2ML IV SOLN
INTRAVENOUS | Status: DC | PRN
  Administered 2023-10-19: 200 mg via INTRAVENOUS

## 2023-10-19 MED ORDER — LIDOCAINE 2% (20 MG/ML) 5 ML SYRINGE
INTRAMUSCULAR | Status: DC | PRN
  Administered 2023-10-19: 80 mg via INTRAVENOUS

## 2023-10-19 MED ORDER — STERILE WATER FOR IRRIGATION IR SOLN
Status: DC | PRN
  Administered 2023-10-19: 500 mL

## 2023-10-19 MED ORDER — ONDANSETRON HCL 4 MG/2ML IJ SOLN: 4.0000 mg | Freq: Once | INTRAMUSCULAR | Status: DC | PRN

## 2023-10-19 MED ORDER — DEXAMETHASONE SODIUM PHOSPHATE 10 MG/ML IJ SOLN
INTRAMUSCULAR | Status: DC | PRN
  Administered 2023-10-19: 10 mg via INTRAVENOUS

## 2023-10-19 MED ORDER — DOCUSATE SODIUM 100 MG PO CAPS
ORAL_CAPSULE | ORAL | Status: AC
  Filled 2023-10-19: qty 1

## 2023-10-19 MED ORDER — KETOROLAC TROMETHAMINE 15 MG/ML IJ SOLN
INTRAMUSCULAR | Status: AC
  Filled 2023-10-19: qty 1

## 2023-10-19 MED ORDER — OXYCODONE HCL 5 MG PO TABS
ORAL_TABLET | ORAL | Status: AC
  Filled 2023-10-19: qty 1

## 2023-10-19 MED ORDER — OXYCODONE HCL 5 MG PO TABS: 5.0000 mg | ORAL_TABLET | Freq: Once | ORAL | Status: DC | PRN

## 2023-10-19 MED ORDER — MIDAZOLAM HCL 5 MG/5ML IJ SOLN
INTRAMUSCULAR | Status: DC | PRN
  Administered 2023-10-19: 2 mg via INTRAVENOUS

## 2023-10-19 MED ORDER — DROPERIDOL 2.5 MG/ML IJ SOLN
INTRAMUSCULAR | Status: DC | PRN
  Administered 2023-10-19: .625 mg via INTRAVENOUS

## 2023-10-19 MED ORDER — ESCITALOPRAM OXALATE 20 MG PO TABS
20.0000 mg | ORAL_TABLET | Freq: Every day | ORAL | Status: DC
Start: 2023-10-19 — End: 2023-10-20
  Administered 2023-10-19: 20 mg via ORAL
  Filled 2023-10-19: qty 1

## 2023-10-19 MED ORDER — POVIDONE-IODINE 10 % EX SWAB: 2.0000 | Freq: Once | CUTANEOUS | Status: DC

## 2023-10-19 MED ORDER — BUPROPION HCL ER (XL) 150 MG PO TB24
150.0000 mg | ORAL_TABLET | Freq: Every day | ORAL | Status: DC
Start: 2023-10-19 — End: 2023-10-20
  Administered 2023-10-19: 150 mg via ORAL
  Filled 2023-10-19: qty 1

## 2023-10-19 MED ORDER — ALBUMIN HUMAN 25 % IV SOLN: INTRAVENOUS | Status: DC | PRN

## 2023-10-19 MED ORDER — ONDANSETRON HCL 4 MG/2ML IJ SOLN
INTRAMUSCULAR | Status: AC
  Filled 2023-10-19: qty 2

## 2023-10-19 MED ORDER — ROCURONIUM BROMIDE 10 MG/ML (PF) SYRINGE
PREFILLED_SYRINGE | INTRAVENOUS | Status: AC
  Filled 2023-10-19: qty 10

## 2023-10-19 MED ORDER — FENTANYL CITRATE (PF) 100 MCG/2ML IJ SOLN
INTRAMUSCULAR | Status: AC
  Filled 2023-10-19: qty 2

## 2023-10-19 MED ORDER — PROPOFOL 10 MG/ML IV BOLUS
INTRAVENOUS | Status: AC
  Filled 2023-10-19: qty 20

## 2023-10-19 MED ORDER — BUPIVACAINE HCL (PF) 0.25 % IJ SOLN
INTRAMUSCULAR | Status: DC | PRN
  Administered 2023-10-19: 7 mL

## 2023-10-19 MED ORDER — ACETAMINOPHEN 500 MG PO TABS
1000.0000 mg | ORAL_TABLET | Freq: Four times a day (QID) | ORAL | Status: DC
  Administered 2023-10-19 – 2023-10-20 (×3): 1000 mg via ORAL

## 2023-10-19 MED ORDER — HYDROMORPHONE HCL 1 MG/ML IJ SOLN
0.2500 mg | INTRAMUSCULAR | Status: DC | PRN
  Administered 2023-10-19 (×5): 0.25 mg via INTRAVENOUS

## 2023-10-19 MED ORDER — SODIUM CHLORIDE 0.9 % IR SOLN
Status: DC | PRN
  Administered 2023-10-19: 400 mL

## 2023-10-19 MED ORDER — HYDROMORPHONE HCL 1 MG/ML IJ SOLN
INTRAMUSCULAR | Status: AC
  Filled 2023-10-19: qty 1

## 2023-10-19 MED ORDER — OXYCODONE HCL 5 MG PO TABS
ORAL_TABLET | ORAL | Status: AC
  Filled 2023-10-19: qty 2

## 2023-10-19 MED ORDER — ACETAMINOPHEN 10 MG/ML IV SOLN
INTRAVENOUS | Status: AC
  Filled 2023-10-19: qty 100

## 2023-10-19 MED ORDER — ONDANSETRON HCL 4 MG/2ML IJ SOLN: 4.0000 mg | Freq: Four times a day (QID) | INTRAMUSCULAR | Status: DC | PRN

## 2023-10-19 MED ORDER — DOCUSATE SODIUM 100 MG PO CAPS
100.0000 mg | ORAL_CAPSULE | Freq: Two times a day (BID) | ORAL | Status: DC
  Administered 2023-10-19 (×2): 100 mg via ORAL

## 2023-10-19 MED ORDER — FENTANYL CITRATE (PF) 100 MCG/2ML IJ SOLN
INTRAMUSCULAR | Status: DC | PRN
  Administered 2023-10-19 (×2): 50 ug via INTRAVENOUS
  Administered 2023-10-19 (×2): 25 ug via INTRAVENOUS
  Administered 2023-10-19: 50 ug via INTRAVENOUS

## 2023-10-19 MED ORDER — ONDANSETRON HCL 4 MG/2ML IJ SOLN
INTRAMUSCULAR | Status: DC | PRN
  Administered 2023-10-19: 4 mg via INTRAVENOUS

## 2023-10-19 MED ORDER — MIDAZOLAM HCL 2 MG/2ML IJ SOLN
INTRAMUSCULAR | Status: AC
  Filled 2023-10-19: qty 2

## 2023-10-19 MED ORDER — ACETAMINOPHEN 500 MG PO TABS
ORAL_TABLET | ORAL | Status: AC
  Filled 2023-10-19: qty 2

## 2023-10-19 MED ORDER — CLINDAMYCIN PHOSPHATE 900 MG/50ML IV SOLN
INTRAVENOUS | Status: AC
  Filled 2023-10-19: qty 50

## 2023-10-19 MED ORDER — LISDEXAMFETAMINE DIMESYLATE 20 MG PO CAPS: 20.0000 mg | ORAL_CAPSULE | Freq: Every evening | ORAL | Status: DC

## 2023-10-19 MED ORDER — ACETAMINOPHEN 10 MG/ML IV SOLN
1000.0000 mg | Freq: Once | INTRAVENOUS | Status: DC | PRN
  Administered 2023-10-19: 1000 mg via INTRAVENOUS

## 2023-10-19 MED ORDER — KETOROLAC TROMETHAMINE 15 MG/ML IJ SOLN
15.0000 mg | Freq: Four times a day (QID) | INTRAMUSCULAR | Status: DC
  Administered 2023-10-19 – 2023-10-20 (×4): 15 mg via INTRAVENOUS

## 2023-10-19 MED ORDER — DEXMEDETOMIDINE HCL IN NACL 80 MCG/20ML IV SOLN
INTRAVENOUS | Status: AC
  Filled 2023-10-19: qty 20

## 2023-10-19 MED ORDER — HEMOSTATIC AGENTS (NO CHARGE) OPTIME
TOPICAL | Status: DC | PRN
  Administered 2023-10-19: 1

## 2023-10-19 MED ORDER — PROPOFOL 10 MG/ML IV BOLUS
INTRAVENOUS | Status: DC | PRN
  Administered 2023-10-19: 160 mg via INTRAVENOUS

## 2023-10-19 MED ORDER — MENTHOL 3 MG MT LOZG: 1.0000 | LOZENGE | OROMUCOSAL | Status: DC | PRN

## 2023-10-19 MED ORDER — 0.9 % SODIUM CHLORIDE (POUR BTL) OPTIME
TOPICAL | Status: DC | PRN
  Administered 2023-10-19: 500 mL

## 2023-10-19 MED ORDER — ALBUMIN HUMAN 5 % IV SOLN
INTRAVENOUS | Status: AC
  Filled 2023-10-19: qty 250

## 2023-10-19 SURGICAL SUPPLY — 62 items
ADH SKN CLS APL DERMABOND .7 (GAUZE/BANDAGES/DRESSINGS) ×1
APL SRG 38 LTWT LNG FL B (MISCELLANEOUS) ×1
APL SWBSTK 6 STRL LF DISP (MISCELLANEOUS) ×1
APPLICATOR ARISTA FLEXITIP XL (MISCELLANEOUS) IMPLANT
APPLICATOR COTTON TIP 6 STRL (MISCELLANEOUS) IMPLANT
APPLICATOR COTTON TIP 6IN STRL (MISCELLANEOUS) ×1
BLADE CLIPPER SENSICLIP SURGIC (BLADE) IMPLANT
CATH ROBINSON RED A/P 16FR (CATHETERS) ×1 IMPLANT
COVER BACK TABLE 60X90IN (DRAPES) ×1 IMPLANT
COVER MAYO STAND STRL (DRAPES) ×2 IMPLANT
DERMABOND ADVANCED .7 DNX12 (GAUZE/BANDAGES/DRESSINGS) IMPLANT
DRAPE SURG IRRIG POUCH 19X23 (DRAPES) ×1 IMPLANT
DRSG COVADERM PLUS 2X2 (GAUZE/BANDAGES/DRESSINGS) ×1 IMPLANT
DRSG OPSITE POSTOP 3X4 (GAUZE/BANDAGES/DRESSINGS) ×1 IMPLANT
DURAPREP 26ML APPLICATOR (WOUND CARE) ×1 IMPLANT
ELECT REM PT RETURN 9FT ADLT (ELECTROSURGICAL) ×1
ELECTRODE REM PT RTRN 9FT ADLT (ELECTROSURGICAL) IMPLANT
FORCEPS CUTTING 45CM 5MM (CUTTING FORCEPS) IMPLANT
GAUZE 4X4 16PLY ~~LOC~~+RFID DBL (SPONGE) ×1 IMPLANT
GLOVE BIO SURGEON STRL SZ 6 (GLOVE) ×3 IMPLANT
GLOVE BIOGEL PI IND STRL 6 (GLOVE) ×2 IMPLANT
GLOVE ECLIPSE 6.0 STRL STRAW (GLOVE) ×3 IMPLANT
GLOVE ECLIPSE 6.5 STRL STRAW (GLOVE) ×2 IMPLANT
GLOVE SS PI 5.5 STRL (GLOVE) ×2 IMPLANT
HEMOSTAT ARISTA ABSORB 3G PWDR (HEMOSTASIS) IMPLANT
HOLDER FOLEY CATH W/STRAP (MISCELLANEOUS) ×1 IMPLANT
IRRIGATION STRYKERFLOW (MISCELLANEOUS) IMPLANT
IRRIGATOR STRYKERFLOW (MISCELLANEOUS)
IV NS 1000ML (IV SOLUTION)
IV NS 1000ML BAXH (IV SOLUTION) IMPLANT
IV NS IRRIG 3000ML ARTHROMATIC (IV SOLUTION) IMPLANT
KIT PINK PAD W/HEAD ARE REST (MISCELLANEOUS) ×1
KIT PINK PAD W/HEAD ARM REST (MISCELLANEOUS) ×2 IMPLANT
KIT TURNOVER CYSTO (KITS) ×1 IMPLANT
LIGASURE IMPACT 36 18CM CVD LR (INSTRUMENTS) IMPLANT
LIGASURE LAP L-HOOKWIRE 5 44CM (INSTRUMENTS) IMPLANT
MANIPULATOR UTERINE 4.5 ZUMI (MISCELLANEOUS) IMPLANT
NDL INSUFFLATION 14GA 120MM (NEEDLE) ×1 IMPLANT
NEEDLE INSUFFLATION 14GA 120MM (NEEDLE) ×1 IMPLANT
NS IRRIG 500ML POUR BTL (IV SOLUTION) ×1 IMPLANT
PACK LAVH (CUSTOM PROCEDURE TRAY) ×1 IMPLANT
PACK ROBOTIC GOWN (GOWN DISPOSABLE) ×1 IMPLANT
PAD OB MATERNITY 4.3X12.25 (PERSONAL CARE ITEMS) ×1 IMPLANT
PAD PREP 24X48 CUFFED NSTRL (MISCELLANEOUS) ×1 IMPLANT
SCISSORS LAP 5X35 DISP (ENDOMECHANICALS) IMPLANT
SET TUBE SMOKE EVAC HIGH FLOW (TUBING) ×1 IMPLANT
SLEEVE SCD COMPRESS KNEE MED (STOCKING) ×1 IMPLANT
SPIKE FLUID TRANSFER (MISCELLANEOUS) IMPLANT
SUT MNCRL 0 MO-4 VIOLET 18 CR (SUTURE) ×2 IMPLANT
SUT MNCRL AB 3-0 PS2 18 (SUTURE) ×1 IMPLANT
SUT MON AB 2-0 CT1 36 (SUTURE) IMPLANT
SUT VIC AB 0 CT1 27 (SUTURE)
SUT VIC AB 0 CT1 27XCR 8 STRN (SUTURE) IMPLANT
SUT VICRYL 0 TIES 12 18 (SUTURE) ×1 IMPLANT
SUT VICRYL 0 UR6 27IN ABS (SUTURE) ×1 IMPLANT
SYR BULB IRRIG 60ML STRL (SYRINGE) IMPLANT
TOWEL OR 17X24 6PK STRL BLUE (TOWEL DISPOSABLE) ×1 IMPLANT
TRAY FOLEY W/BAG SLVR 14FR LF (SET/KITS/TRAYS/PACK) ×1 IMPLANT
TROCAR Z-THREAD FIOS 11X100 BL (TROCAR) ×1 IMPLANT
TROCAR Z-THREAD FIOS 5X100MM (TROCAR) ×1 IMPLANT
WARMER LAPAROSCOPE (MISCELLANEOUS) ×1 IMPLANT
WATER STERILE IRR 500ML POUR (IV SOLUTION) IMPLANT

## 2023-10-19 NOTE — Anesthesia Procedure Notes (Signed)
Procedure Name: Intubation Date/Time: 10/19/2023 7:40 AM  Performed by: Larken Urias D, CRNAPre-anesthesia Checklist: Patient identified, Emergency Drugs available, Suction available and Patient being monitored Patient Re-evaluated:Patient Re-evaluated prior to induction Oxygen Delivery Method: Circle system utilized Preoxygenation: Pre-oxygenation with 100% oxygen Induction Type: IV induction Ventilation: Mask ventilation without difficulty Laryngoscope Size: Mac and 3 Tube type: Oral Tube size: 7.0 mm Number of attempts: 1 Airway Equipment and Method: Stylet and Oral airway Placement Confirmation: ETT inserted through vocal cords under direct vision, positive ETCO2 and breath sounds checked- equal and bilateral Secured at: 22 cm Tube secured with: Tape Dental Injury: Teeth and Oropharynx as per pre-operative assessment

## 2023-10-19 NOTE — Op Note (Signed)
PROCEDURE DATE: 10/19/2023  PREOPERATIVE DIAGNOSIS: Menorrhagia, dysmenorrhea  POSTOPERATIVE DIAGNOSIS: The same  PROCEDURE: Laparoscopic Assisted Vaginal Hysterectomy, Bilateral Salpingectomy SURGEON: Dr. Mitchel Honour  ASSISTANT: Dr. Zelphia Cairo INDICATIONS: 47 y.o. G2P2 with menorrhagia and dysmenorrhea desiring definitive surgical management. Risks of surgery were discussed with the patient including but not limited to: bleeding which may require transfusion or reoperation; infection which may require antibiotics; injury to bowel, bladder, ureters or other surrounding organs; need for additional procedures including laparotomy; thromboembolic phenomenon, incisional problems and other postoperative/anesthesia complications. Written informed consent was obtained.  FINDINGS: Slightly enlarged uterus with posterior right fibroid in the broad ligament, normal adnexa bilaterally. No evidence of endometriosis. Normal upper abdomen.  ANESTHESIA: General  ESTIMATED BLOOD LOSS: 600 ml  SPECIMENS: Uterus and cervix, bilateral fallopian tubes COMPLICATIONS: None immediate  PROCEDURE IN DETAIL: The patient received intravenous antibiotics and had sequential compression devices applied to her lower extremities while in the preoperative area. She was then taken to the operating room where general anesthesia was administered and was found to be adequate. She was placed in the dorsal lithotomy position, and was prepped and draped in a sterile manner. An in and out catheterization was performed. A uterine manipulator was then advanced into the uterus . After an adequate timeout was performed, attention was then turned to the patient's abdomen where a 10-mm skin incision was made in the umbilical fold. The Veress needle was carefully introduced into the peritoneal cavity through the abdominal wall. Intraperitoneal placement was confirmed by drop in intraabdominal pressure with insufflation of carbon dioxide gas.  Adequate pneumoperitoneum was obtained, and the 10/11 XL trocar and sleeve were then advanced without difficulty into the abdomen where intraabdominal placement was confirmed by the laparoscope. A survey of the patient's pelvis and abdomen revealed the above dictated findings.  Suprapubic 5 XL port was then placed under direct visualization. The pelvis was then carefully examined. On the right side, the fallopian tube was elevated and freed from the mesosalpinx using the Gyrus.  The round ligament was then clamped, coagulated and transected with the Gyrus. The uteroovarian ligament was also clamped and transected. The leaves of the broad ligament were separated and serially transected.  These procedures were then repeated on the left side. The ureters were noted to be safely away from the area of dissection.  At this point, attention was turned to the vaginal portion of the case. A weighted speculum was placed posteriorly, a Deaver anteriorly, and the cervix grasped with a thyroid tenaculum. Once the anterior and posterior reflections were identified, the cervix was circumscribed using the Bovie knife. Next, using Mayos, the posterior cul-de-sac was entered. The uterosacrals were clamped, cut and suture was placed and tagged.  Next, the bladder reflection was identified. Using Metzenbaums, it was entered and palpation and direct visualization confirmed proper location. Next, using the LigaSure, the uterine arteries were coapted and cut bilaterally. The pedicles were visualized after coaptation and were hemostatic. The same was performed sequentially cephalad until the uterus, cervix, and bilateral fallopian tubes were removed. The pedicles were inspected and found to be hemostatic.  Next, the tagged uterosacrals were tied together in midline. Figure-of-8 stitches using monocryl were used to close the remainder of the cuff. The cuff was inspected and found to be hemostatic. Foley catheter was inserted and drained  clear, yellow urine. Attention was returned to the abdomen were a second laparoscopic look was taken. All pedicles were hemostatic. Arista was applied to cuff to ensure continued hemostasis. Insufflation  was removed after all instruments were removed.  Infraumbilical fascial incision was closed with 0 vicryl figure of 8 stitch.  Both skin incisions were closed with 4-0 monocryl subcuticular stitches and Dermabond. The patient tolerated the procedures well. All instruments, needles, and sponge counts were correct x 2. The patient was taken to the recovery room awake, extubated and in stable condition.

## 2023-10-19 NOTE — Progress Notes (Signed)
NOS  Patient sleeping.  She reports pain dramatically improved after catheter removal.  She has not voided since removal.  She denies current pain.  Tolerating po; no N/V.  No CP/SOB.       10/19/2023   12:27 PM 10/19/2023   11:55 AM 10/19/2023   11:45 AM  Vitals with BMI  Systolic 91 91 95  Diastolic 61 66 55  Pulse 78 88    Gen: A&O x 3 Abd: soft, incisions c/d/I  x 2 Ext: no c/c/e with SCDs on  POD#0 s/p LAVH, BS -Continue pain control -Advance diet -Get up to try to void -AM labs pending -Saline lock in AM  Mitchel Honour, DO

## 2023-10-19 NOTE — Anesthesia Preprocedure Evaluation (Signed)
Anesthesia Evaluation  Patient identified by MRN, date of birth, ID band Patient awake    Reviewed: Allergy & Precautions, H&P , NPO status , Patient's Chart, lab work & pertinent test results  Airway Mallampati: II  TM Distance: >3 FB Neck ROM: Full    Dental no notable dental hx.    Pulmonary neg pulmonary ROS   Pulmonary exam normal breath sounds clear to auscultation       Cardiovascular negative cardio ROS Normal cardiovascular exam Rhythm:Regular Rate:Normal     Neuro/Psych   Anxiety Depression    negative neurological ROS     GI/Hepatic negative GI ROS, Neg liver ROS,,,  Endo/Other  negative endocrine ROS    Renal/GU negative Renal ROS  negative genitourinary   Musculoskeletal negative musculoskeletal ROS (+)    Abdominal   Peds negative pediatric ROS (+)  Hematology negative hematology ROS (+)   Anesthesia Other Findings   Reproductive/Obstetrics negative OB ROS                             Anesthesia Physical Anesthesia Plan  ASA: 2  Anesthesia Plan: General   Post-op Pain Management: Toradol IV (intra-op)*   Induction: Intravenous  PONV Risk Score and Plan: 3 and Ondansetron, Dexamethasone, Treatment may vary due to age or medical condition and Droperidol  Airway Management Planned: Oral ETT  Additional Equipment:   Intra-op Plan:   Post-operative Plan: Extubation in OR  Informed Consent: I have reviewed the patients History and Physical, chart, labs and discussed the procedure including the risks, benefits and alternatives for the proposed anesthesia with the patient or authorized representative who has indicated his/her understanding and acceptance.     Dental advisory given  Plan Discussed with: CRNA and Surgeon  Anesthesia Plan Comments:        Anesthesia Quick Evaluation

## 2023-10-19 NOTE — Transfer of Care (Signed)
Immediate Anesthesia Transfer of Care Note  Patient: Alexa Leon  Procedure(s) Performed: LAPAROSCOPIC ASSISTED VAGINAL HYSTERECTOMY WITH SALPINGECTOMY (Vagina )  Patient Location: PACU  Anesthesia Type:General  Level of Consciousness: awake, alert , and oriented  Airway & Oxygen Therapy: Patient Spontanous Breathing and Patient connected to nasal cannula oxygen  Post-op Assessment: Report given to RN and Post -op Vital signs reviewed and stable  Post vital signs: Reviewed and stable  Last Vitals:  Vitals Value Taken Time  BP 111/60 10/19/23 1035  Temp    Pulse 91 10/19/23 1039  Resp 12 10/19/23 1039  SpO2 98 % 10/19/23 1039  Vitals shown include unfiled device data.  Last Pain:  Vitals:   10/19/23 0603  TempSrc: Oral  PainSc: 4       Patients Stated Pain Goal: 5 (10/19/23 0603)  Complications: No notable events documented.

## 2023-10-19 NOTE — Anesthesia Postprocedure Evaluation (Signed)
Anesthesia Post Note  Patient: Alexa Leon  Procedure(s) Performed: LAPAROSCOPIC ASSISTED VAGINAL HYSTERECTOMY WITH SALPINGECTOMY (Vagina )     Patient location during evaluation: Other Anesthesia Type: General Level of consciousness: awake and alert Pain management: pain level controlled Vital Signs Assessment: post-procedure vital signs reviewed and stable Respiratory status: spontaneous breathing, nonlabored ventilation, respiratory function stable and patient connected to nasal cannula oxygen Cardiovascular status: blood pressure returned to baseline and stable Postop Assessment: no apparent nausea or vomiting Anesthetic complications: no  No notable events documented.  Last Vitals:  Vitals:   10/19/23 1130 10/19/23 1145  BP: (!) 97/55 (!) 95/55  Pulse: 86   Resp: 13   Temp: 37.1 C   SpO2: 100%     Last Pain:  Vitals:   10/19/23 1130  TempSrc:   PainSc: 5                  Johm Pfannenstiel S

## 2023-10-19 NOTE — Progress Notes (Signed)
No change to H&P.  Alexa Cavazos, DO 

## 2023-10-20 DIAGNOSIS — N920 Excessive and frequent menstruation with regular cycle: Secondary | ICD-10-CM | POA: Diagnosis not present

## 2023-10-20 LAB — CBC
HCT: 28.4 % — ABNORMAL LOW (ref 36.0–46.0)
Hemoglobin: 9.2 g/dL — ABNORMAL LOW (ref 12.0–15.0)
MCH: 27.7 pg (ref 26.0–34.0)
MCHC: 32.4 g/dL (ref 30.0–36.0)
MCV: 85.5 fL (ref 80.0–100.0)
Platelets: 253 10*3/uL (ref 150–400)
RBC: 3.32 MIL/uL — ABNORMAL LOW (ref 3.87–5.11)
RDW: 13.4 % (ref 11.5–15.5)
WBC: 12 10*3/uL — ABNORMAL HIGH (ref 4.0–10.5)
nRBC: 0 % (ref 0.0–0.2)

## 2023-10-20 LAB — COMPREHENSIVE METABOLIC PANEL
ALT: 16 U/L (ref 0–44)
AST: 19 U/L (ref 15–41)
Albumin: 3.3 g/dL — ABNORMAL LOW (ref 3.5–5.0)
Alkaline Phosphatase: 39 U/L (ref 38–126)
Anion gap: 7 (ref 5–15)
BUN: 12 mg/dL (ref 6–20)
CO2: 23 mmol/L (ref 22–32)
Calcium: 8.1 mg/dL — ABNORMAL LOW (ref 8.9–10.3)
Chloride: 103 mmol/L (ref 98–111)
Creatinine, Ser: 0.94 mg/dL (ref 0.44–1.00)
GFR, Estimated: >60 mL/min (ref >60)
Glucose, Bld: 117 mg/dL — ABNORMAL HIGH (ref 70–99)
Potassium: 3.9 mmol/L (ref 3.5–5.1)
Sodium: 133 mmol/L — ABNORMAL LOW (ref 135–145)
Total Bilirubin: 0.5 mg/dL (ref <1.2)
Total Protein: 6 g/dL — ABNORMAL LOW (ref 6.5–8.1)

## 2023-10-20 LAB — SURGICAL PATHOLOGY

## 2023-10-20 MED ORDER — OXYCODONE HCL 5 MG PO TABS
ORAL_TABLET | ORAL | Status: AC
  Filled 2023-10-20: qty 2

## 2023-10-20 MED ORDER — ACETAMINOPHEN 500 MG PO TABS
ORAL_TABLET | ORAL | Status: AC
  Filled 2023-10-20: qty 2

## 2023-10-20 MED ORDER — KETOROLAC TROMETHAMINE 15 MG/ML IJ SOLN
INTRAMUSCULAR | Status: AC
  Filled 2023-10-20: qty 1

## 2023-10-20 MED ORDER — OXYCODONE-ACETAMINOPHEN 5-325 MG PO TABS: 1.0000 | ORAL_TABLET | ORAL | 0 refills | Status: AC | PRN

## 2023-10-20 MED ORDER — IBUPROFEN 600 MG PO TABS: 600.0000 mg | ORAL_TABLET | Freq: Four times a day (QID) | ORAL | 0 refills | Status: AC | PRN

## 2023-10-20 NOTE — Progress Notes (Signed)
1 Day Post-Op Procedure(s) (LRB): LAPAROSCOPIC ASSISTED VAGINAL HYSTERECTOMY WITH SALPINGECTOMY (N/A)  Subjective: Patient reports tolerating PO, + flatus, and no problems voiding.    Objective: I have reviewed patient's vital signs, intake and output, medications, and labs.  General: alert, cooperative, and appears stated age Extremities: extremities normal, atraumatic, no cyanosis or edema Abd: soft, ND.  Inc c/d/I x 2  Assessment: s/p Procedure(s): LAPAROSCOPIC ASSISTED VAGINAL HYSTERECTOMY WITH SALPINGECTOMY (N/A): stable, progressing well, and tolerating diet  Plan: Discharge home  LOS: 0 days    Mitchel Honour, DO 10/20/2023, 7:13 AM

## 2023-10-20 NOTE — Discharge Summary (Signed)
Physician Discharge Summary  Patient ID: Alexa Leon MRN: [id number] DOB/AGE: [birth year] 45 y.o.  Admit date: 10/19/2023 Discharge date: 10/20/2023  Admission Diagnoses: Heavy menstrual bleeding, dysmenorrhea  Discharge Diagnoses:  Principal Problem:   Menorrhagia Active Problems:   S/P laparoscopic assisted vaginal hysterectomy (LAVH)   Discharged Condition: good  Hospital Course: Patient was admitted for obs for anticipated LAVH, BS.  This procedure was completed without complication.  On POD#1, patient was meeting all postop goals to include ambulating well, tolerating full diet, voiding without difficulty, passing flatus and adequate pain control with po meds.  Patient was discharged home with planned outpatient f/u in 2 weeks.   Consults: None  Significant Diagnostic Studies: none  Treatments: surgery: LAVH, BS  Discharge Exam: Blood pressure 90/60, pulse 75, temperature 99.5 F (37.5 C), resp. rate 12, height 5\' 6"  (1.676 m), weight 81.1 kg, last menstrual period 10/18/2023, SpO2 98%. General appearance: alert, cooperative, and appears stated age Extremities: extremities normal, atraumatic, no cyanosis or edema Abd: soft, ND.  Inc c/d/I x 2  Disposition: Discharge disposition: 01-Home or Self Care       Discharge Instructions     Discharge patient   Complete by: As directed    Discharge disposition: 01-Home or Self Care   Discharge patient date: 10/20/2023      Allergies as of 10/20/2023       Reactions   Amoxicillin Anaphylaxis, Swelling   Swelling lips, blisters in the throat        Medication List     TAKE these medications    buPROPion 150 MG 24 hr tablet Commonly known as: Wellbutrin XL Take 1 tablet (150 mg total) by mouth daily.   escitalopram 20 MG tablet Commonly known as: LEXAPRO Take 1 tablet (20 mg total) by mouth daily.   ibuprofen 600 MG tablet Commonly known as: ADVIL Take 1 tablet (600 mg total) by mouth every 6 (six)  hours as needed. What changed:  medication strength how much to take   oxyCODONE-acetaminophen 5-325 MG tablet Commonly known as: Percocet Take 1 tablet by mouth every 4 (four) hours as needed for up to 7 days for severe pain (pain score 7-10).   Vyvanse 40 MG capsule Generic drug: lisdexamfetamine Take 40 mg by mouth every morning.   lisdexamfetamine 20 MG capsule Commonly known as: VYVANSE Take 20 mg by mouth every evening.         Signed: Mitchel Honour 10/20/2023, 7:16 AM

## 2023-10-20 NOTE — Discharge Instructions (Signed)
Call MD for T>100.4, heavy vaginal bleeding, severe abdominal pain, intractable nausea and/or vomiting, or respiratory distress.  Call office to schedule postop appointment in 2 weeks.  Pelvic rest and no heavy lifting x 6 weeks.  No driving while taking narcotics.   

## 2023-10-21 ENCOUNTER — Encounter (HOSPITAL_BASED_OUTPATIENT_CLINIC_OR_DEPARTMENT_OTHER): Payer: Self-pay | Admitting: Obstetrics & Gynecology

## 2023-12-07 ENCOUNTER — Other Ambulatory Visit: Payer: Self-pay | Admitting: Family Medicine

## 2023-12-07 DIAGNOSIS — F32A Depression, unspecified: Secondary | ICD-10-CM

## 2023-12-07 DIAGNOSIS — F339 Major depressive disorder, recurrent, unspecified: Secondary | ICD-10-CM

## 2023-12-07 DIAGNOSIS — F419 Anxiety disorder, unspecified: Secondary | ICD-10-CM

## 2023-12-10 ENCOUNTER — Encounter (INDEPENDENT_AMBULATORY_CARE_PROVIDER_SITE_OTHER): Payer: Self-pay | Admitting: *Deleted

## 2024-01-15 ENCOUNTER — Other Ambulatory Visit: Payer: Self-pay | Admitting: Family Medicine

## 2024-01-15 DIAGNOSIS — F339 Major depressive disorder, recurrent, unspecified: Secondary | ICD-10-CM

## 2024-01-15 DIAGNOSIS — F32A Depression, unspecified: Secondary | ICD-10-CM

## 2024-01-15 DIAGNOSIS — F419 Anxiety disorder, unspecified: Secondary | ICD-10-CM

## 2024-02-21 ENCOUNTER — Other Ambulatory Visit: Payer: Self-pay | Admitting: Family Medicine

## 2024-02-21 DIAGNOSIS — F339 Major depressive disorder, recurrent, unspecified: Secondary | ICD-10-CM

## 2024-02-21 DIAGNOSIS — F419 Anxiety disorder, unspecified: Secondary | ICD-10-CM

## 2024-02-22 NOTE — Telephone Encounter (Signed)
 Left message for pt to call back to schedule appt with PCP for med refill.

## 2024-02-22 NOTE — Telephone Encounter (Signed)
 Dettinger pt NTBS 30-d given 01/15/24

## 2024-03-15 ENCOUNTER — Ambulatory Visit: Payer: Self-pay | Admitting: Family Medicine

## 2024-03-18 ENCOUNTER — Ambulatory Visit: Payer: Self-pay | Admitting: Family Medicine

## 2024-03-18 ENCOUNTER — Encounter: Payer: Self-pay | Admitting: Family Medicine

## 2024-03-18 VITALS — BP 111/75 | HR 82 | Ht 66.0 in | Wt 179.0 lb

## 2024-03-18 DIAGNOSIS — F339 Major depressive disorder, recurrent, unspecified: Secondary | ICD-10-CM

## 2024-03-18 DIAGNOSIS — F32A Depression, unspecified: Secondary | ICD-10-CM

## 2024-03-18 DIAGNOSIS — F419 Anxiety disorder, unspecified: Secondary | ICD-10-CM | POA: Diagnosis not present

## 2024-03-18 DIAGNOSIS — Z1211 Encounter for screening for malignant neoplasm of colon: Secondary | ICD-10-CM | POA: Diagnosis not present

## 2024-03-18 MED ORDER — BUPROPION HCL ER (XL) 150 MG PO TB24: 150.0000 mg | ORAL_TABLET | Freq: Every day | ORAL | 3 refills | Status: AC

## 2024-03-18 MED ORDER — ESCITALOPRAM OXALATE 20 MG PO TABS: 20.0000 mg | ORAL_TABLET | Freq: Every day | ORAL | 3 refills | Status: AC

## 2024-03-18 NOTE — Progress Notes (Signed)
 BP 111/75   Pulse 82   Ht 5\' 6"  (1.676 m)   Wt 179 lb (81.2 kg)   SpO2 99%   BMI 28.89 kg/m    Subjective:   Patient ID: Alexa Leon, female    DOB: 1976/08/12, 48 y.o.   MRN: [id number]  HPI: Alexa Leon is a 48 y.o. female presenting on 03/18/2024 for Medical Management of Chronic Issues, Anxiety, and Depression   HPI Anxiety and depression recheck Patient is taking Lexapro has been helping but she ran out of the Wellbutrin a couple months ago and she is definitely feeling like she is not doing as well on it.  She does have some passing invasive thoughts of feeling better off not being here but no suicidal ideations or plans.  She does want to get back on it because she does feel like it helped her so much.    03/18/2024    2:53 PM 05/28/2023    3:37 PM 04/01/2023    9:23 AM 02/16/2023    3:31 PM 01/15/2023    3:26 PM  Depression screen PHQ 2/9  Decreased Interest 1 0 1 1 2   Down, Depressed, Hopeless 1 1 1 1 2   PHQ - 2 Score 2 1 2 2 4   Altered sleeping 1 1 1 1 2   Tired, decreased energy 1 1 1  0 2  Change in appetite 0 0 0 0 2  Feeling bad or failure about yourself  1 0 1 0 2  Trouble concentrating 1 0 1 1 1   Moving slowly or fidgety/restless 0 0 0 0 1  Suicidal thoughts 0 0 0 0 1  PHQ-9 Score 6 3 6 4 15   Difficult doing work/chores Somewhat difficult Not difficult at all Somewhat difficult Not difficult at all Somewhat difficult    Relevant past medical, surgical, family and social history reviewed and updated as indicated. Interim medical history since our last visit reviewed. Allergies and medications reviewed and updated.  Review of Systems  Constitutional:  Negative for chills and fever.  HENT:  Negative for congestion, ear discharge and ear pain.   Eyes:  Negative for visual disturbance.  Respiratory:  Negative for chest tightness and shortness of breath.   Cardiovascular:  Negative for chest pain and leg swelling.  Genitourinary:  Negative for difficulty  urinating and dysuria.  Musculoskeletal:  Negative for back pain and gait problem.  Skin:  Negative for rash.  Neurological:  Negative for dizziness, light-headedness and headaches.  Psychiatric/Behavioral:  Negative for agitation and behavioral problems.   All other systems reviewed and are negative.   Per HPI unless specifically indicated above   Allergies as of 03/18/2024       Reactions   Amoxicillin Anaphylaxis, Swelling   Swelling lips, blisters in the throat        Medication List        Accurate as of March 18, 2024  3:16 PM. If you have any questions, ask your nurse or doctor.          buPROPion 150 MG 24 hr tablet Commonly known as: Wellbutrin XL Take 1 tablet (150 mg total) by mouth daily.   escitalopram 20 MG tablet Commonly known as: LEXAPRO Take 1 tablet (20 mg total) by mouth daily. What changed: additional instructions Changed by: Elige Radon Tecia Cinnamon   ibuprofen 600 MG tablet Commonly known as: ADVIL Take 1 tablet (600 mg total) by mouth every 6 (six) hours as needed.   Vyvanse 40 MG capsule  Generic drug: lisdexamfetamine Take 40 mg by mouth every morning.   lisdexamfetamine 20 MG capsule Commonly known as: VYVANSE Take 20 mg by mouth every evening.         Objective:   BP 111/75   Pulse 82   Ht 5\' 6"  (1.676 m)   Wt 179 lb (81.2 kg)   SpO2 99%   BMI 28.89 kg/m   Wt Readings from Last 3 Encounters:  03/18/24 179 lb (81.2 kg)  10/19/23 178 lb 12.8 oz (81.1 kg)  10/14/23 179 lb 6.4 oz (81.4 kg)    Physical Exam Vitals and nursing note reviewed.  Constitutional:      General: She is not in acute distress.    Appearance: She is well-developed. She is not diaphoretic.  Eyes:     Conjunctiva/sclera: Conjunctivae normal.  Cardiovascular:     Rate and Rhythm: Normal rate and regular rhythm.     Heart sounds: Normal heart sounds. No murmur heard. Pulmonary:     Effort: Pulmonary effort is normal. No respiratory distress.     Breath  sounds: Normal breath sounds. No wheezing.  Musculoskeletal:        General: No swelling. Normal range of motion.  Skin:    General: Skin is warm and dry.     Findings: No rash.  Neurological:     Mental Status: She is alert and oriented to person, place, and time.     Coordination: Coordination normal.  Psychiatric:        Behavior: Behavior normal.       Assessment & Plan:   Problem List Items Addressed This Visit       Other   Depression, recurrent (HCC)   Relevant Medications   buPROPion (WELLBUTRIN XL) 150 MG 24 hr tablet   escitalopram (LEXAPRO) 20 MG tablet   Other Relevant Orders   CBC with Differential/Platelet   CMP14+EGFR   Lipid panel   Anxiety   Relevant Medications   buPROPion (WELLBUTRIN XL) 150 MG 24 hr tablet   escitalopram (LEXAPRO) 20 MG tablet   Other Relevant Orders   CMP14+EGFR   Lipid panel   Other Visit Diagnoses       Colon cancer screening    -  Primary   Relevant Orders   Cologuard     Anxiety and depression       Relevant Medications   buPROPion (WELLBUTRIN XL) 150 MG 24 hr tablet   escitalopram (LEXAPRO) 20 MG tablet       Will restart Wellbutrin and see how she is doing Follow up plan: Return in about 6 months (around 09/17/2024), or if symptoms worsen or fail to improve, for Physical exam and anxiety depression recheck.  Counseling provided for all of the vaccine components Orders Placed This Encounter  Procedures   Cologuard   CBC with Differential/Platelet   CMP14+EGFR   Lipid panel    Arville Care, MD [facility] 03/18/2024, 3:16 PM

## 2024-04-04 ENCOUNTER — Telehealth: Payer: Self-pay

## 2024-04-04 NOTE — Telephone Encounter (Signed)
 Spoke with pt. States that she has been at [facility]. Number given for [facility] and pt will give them a call.

## 2024-04-04 NOTE — Telephone Encounter (Signed)
 Copied from CRM [id number]. Topic: Clinical - Prescription Issue >> Apr 04, 2024  2:53 PM Donald Frost wrote: Reason for CRM: Alexa Leon with [facility] called stating they received a stool sample for cologuard for the patient but it was not labeled correctly. It did not have the exact date and time of collection. They have been trying to get a hold of the patient and have not been able to. She is requesting for her primary to try and get a hold of her. If the patient is contacted she would need to call them at [phone] and she would hit patient support. There have been several messages left for the patient to no avail.

## 2024-04-07 ENCOUNTER — Encounter: Payer: Self-pay | Admitting: Family Medicine

## 2024-04-07 LAB — COLOGUARD: COLOGUARD: NEGATIVE

## 2024-04-29 ENCOUNTER — Other Ambulatory Visit (HOSPITAL_BASED_OUTPATIENT_CLINIC_OR_DEPARTMENT_OTHER): Payer: Self-pay

## 2024-04-29 MED ORDER — LISDEXAMFETAMINE DIMESYLATE 10 MG PO CAPS
10.0000 mg | ORAL_CAPSULE | Freq: Every day | ORAL | 0 refills | Status: DC
  Filled 2024-04-29 – 2024-05-10 (×2): qty 30, 30d supply, fill #0

## 2024-04-29 MED ORDER — LISDEXAMFETAMINE DIMESYLATE 50 MG PO CAPS
50.0000 mg | ORAL_CAPSULE | Freq: Every morning | ORAL | 0 refills | Status: DC
  Filled 2024-04-29 – 2024-05-10 (×2): qty 30, 30d supply, fill #0

## 2024-05-06 ENCOUNTER — Other Ambulatory Visit (HOSPITAL_BASED_OUTPATIENT_CLINIC_OR_DEPARTMENT_OTHER): Payer: Self-pay

## 2024-05-10 ENCOUNTER — Other Ambulatory Visit (HOSPITAL_BASED_OUTPATIENT_CLINIC_OR_DEPARTMENT_OTHER): Payer: Self-pay

## 2024-06-13 ENCOUNTER — Other Ambulatory Visit (HOSPITAL_BASED_OUTPATIENT_CLINIC_OR_DEPARTMENT_OTHER): Payer: Self-pay

## 2024-06-13 MED ORDER — LISDEXAMFETAMINE DIMESYLATE 10 MG PO CAPS
10.0000 mg | ORAL_CAPSULE | Freq: Every day | ORAL | 0 refills | Status: DC
  Filled 2024-06-13: qty 30, 30d supply, fill #0

## 2024-06-13 MED ORDER — LISDEXAMFETAMINE DIMESYLATE 50 MG PO CAPS
50.0000 mg | ORAL_CAPSULE | Freq: Every morning | ORAL | 0 refills | Status: DC
  Filled 2024-06-13: qty 30, 30d supply, fill #0

## 2024-07-25 ENCOUNTER — Other Ambulatory Visit (HOSPITAL_BASED_OUTPATIENT_CLINIC_OR_DEPARTMENT_OTHER): Payer: Self-pay

## 2024-07-25 MED ORDER — LISDEXAMFETAMINE DIMESYLATE 10 MG PO CAPS
10.0000 mg | ORAL_CAPSULE | Freq: Every day | ORAL | 0 refills | Status: DC
  Filled 2024-07-25: qty 30, 30d supply, fill #0

## 2024-07-25 MED ORDER — LISDEXAMFETAMINE DIMESYLATE 50 MG PO CAPS
50.0000 mg | ORAL_CAPSULE | Freq: Every morning | ORAL | 0 refills | Status: DC
  Filled 2024-07-25: qty 30, 30d supply, fill #0

## 2024-08-26 ENCOUNTER — Other Ambulatory Visit (HOSPITAL_BASED_OUTPATIENT_CLINIC_OR_DEPARTMENT_OTHER): Payer: Self-pay

## 2024-08-26 MED ORDER — LISDEXAMFETAMINE DIMESYLATE 10 MG PO CAPS
10.0000 mg | ORAL_CAPSULE | Freq: Every evening | ORAL | 0 refills | Status: DC
  Filled 2024-08-26: qty 30, 30d supply, fill #0

## 2024-08-26 MED ORDER — LISDEXAMFETAMINE DIMESYLATE 50 MG PO CAPS
50.0000 mg | ORAL_CAPSULE | Freq: Every morning | ORAL | 0 refills | Status: DC
  Filled 2024-08-26: qty 30, 30d supply, fill #0

## 2024-09-19 ENCOUNTER — Encounter: Payer: Self-pay | Admitting: Family Medicine

## 2024-09-19 ENCOUNTER — Ambulatory Visit: Admitting: Family Medicine

## 2024-09-19 VITALS — BP 113/76 | HR 85 | Temp 98.0°F | Ht 66.0 in | Wt 174.0 lb

## 2024-09-19 DIAGNOSIS — F339 Major depressive disorder, recurrent, unspecified: Secondary | ICD-10-CM | POA: Diagnosis not present

## 2024-09-19 DIAGNOSIS — N951 Menopausal and female climacteric states: Secondary | ICD-10-CM

## 2024-09-19 DIAGNOSIS — F419 Anxiety disorder, unspecified: Secondary | ICD-10-CM | POA: Diagnosis not present

## 2024-09-19 DIAGNOSIS — Z01411 Encounter for gynecological examination (general) (routine) with abnormal findings: Secondary | ICD-10-CM | POA: Diagnosis not present

## 2024-09-19 DIAGNOSIS — Z01419 Encounter for gynecological examination (general) (routine) without abnormal findings: Secondary | ICD-10-CM

## 2024-09-19 LAB — CBC WITH DIFFERENTIAL/PLATELET

## 2024-09-19 LAB — LIPID PANEL

## 2024-09-19 MED ORDER — ESTRADIOL 0.5 MG PO TABS: 0.5000 mg | ORAL_TABLET | Freq: Every day | ORAL | 3 refills | Status: DC

## 2024-09-19 NOTE — Progress Notes (Signed)
 BP 113/76   Pulse 85   Temp 98 F (36.7 C)   Ht 5' 6 (1.676 m)   Wt 174 lb (78.9 kg)   SpO2 99%   BMI 28.08 kg/m    Subjective:   Patient ID: Alexa Leon, female    DOB: 1976-07-10, 48 y.o.   MRN: [id number]  HPI: Alexa Leon is a 48 y.o. female presenting on 09/19/2024 for Medical Management of Chronic Issues (CPE- no pap, breast exam only), Anxiety, and Depression   Discussed the use of AI scribe software for clinical note transcription with the patient, who gave verbal consent to proceed.  History of Present Illness   Alexa Leon is a 48 year old female who presents for a physical exam and management of perimenopausal symptoms.  Perimenopausal symptoms - Significant hot flashes and night sweats - Difficulty sleeping, including trouble falling asleep and waking between 2 and 3 AM - Hair thinning, particularly on the scalp - Decreased libido, does not want her husband near her  Post-hysterectomy status - Underwent hysterectomy on November 4th of the previous year for heavy bleeding, cysts, and severe cramping - Ovaries were retained - Symptoms were unresponsive to prior treatments including birth control pills and dilation and curettage (D&C) - No post-surgical complications such as bleeding or spotting - No vaginal issues post-hysterectomy  Mood and anxiety symptoms - Stable on Wellbutrin  and Lexapro  for depression and anxiety - Addition of Wellbutrin  about a year ago was beneficial           09/19/2024    3:17 PM 03/18/2024    2:53 PM 05/28/2023    3:37 PM 04/01/2023    9:23 AM 02/16/2023    3:31 PM  Depression screen PHQ 2/9  Decreased Interest 1 1 0 1 1  Down, Depressed, Hopeless 1 1 1 1 1   PHQ - 2 Score 2 2 1 2 2   Altered sleeping 1 1 1 1 1   Tired, decreased energy 1 1 1 1  0  Change in appetite 0 0 0 0 0  Feeling bad or failure about yourself  0 1 0 1 0  Trouble concentrating 0 1 0 1 1  Moving slowly or fidgety/restless 0 0 0 0 0  Suicidal  thoughts 0 0 0 0 0  PHQ-9 Score 4 6 3 6 4   Difficult doing work/chores Not difficult at all Somewhat difficult Not difficult at all Somewhat difficult Not difficult at all      Relevant past medical, surgical, family and social history reviewed and updated as indicated. Interim medical history since our last visit reviewed. Allergies and medications reviewed and updated.  Review of Systems  Constitutional:  Negative for chills and fever.  HENT:  Negative for congestion, ear discharge and ear pain.   Eyes:  Negative for redness and visual disturbance.  Respiratory:  Negative for chest tightness and shortness of breath.   Cardiovascular:  Negative for chest pain and leg swelling.  Endocrine: Positive for heat intolerance.  Genitourinary:  Negative for difficulty urinating and dysuria.  Musculoskeletal:  Negative for back pain and gait problem.  Skin:  Negative for rash.  Neurological:  Negative for light-headedness and headaches.  Psychiatric/Behavioral:  Negative for agitation and behavioral problems.   All other systems reviewed and are negative.   Per HPI unless specifically indicated above   Allergies as of 09/19/2024       Reactions   Amoxicillin Anaphylaxis, Swelling   Swelling lips, blisters in the throat  Medication List        Accurate as of September 19, 2024  3:56 PM. If you have any questions, ask your nurse or doctor.          buPROPion  150 MG 24 hr tablet Commonly known as: Wellbutrin  XL Take 1 tablet (150 mg total) by mouth daily.   escitalopram  20 MG tablet Commonly known as: LEXAPRO  Take 1 tablet (20 mg total) by mouth daily.   estradiol  0.5 MG tablet Commonly known as: Estrace  Take 1 tablet (0.5 mg total) by mouth daily. Started by: Fonda LABOR Aniza Shor   ibuprofen  600 MG tablet Commonly known as: ADVIL  Take 1 tablet (600 mg total) by mouth every 6 (six) hours as needed.   lisdexamfetamine 10 MG capsule Commonly known as: Vyvanse  Take 1  capsule (10 mg total) by mouth every afternoon. What changed: Another medication with the same name was removed. Continue taking this medication, and follow the directions you see here. Changed by: Fonda LABOR Ayahna Solazzo   lisdexamfetamine 50 MG capsule Commonly known as: Vyvanse  Take 1 capsule (50 mg total) by mouth every morning. What changed: Another medication with the same name was removed. Continue taking this medication, and follow the directions you see here. Changed by: Fonda LABOR Dashea Mcmullan         Objective:   BP 113/76   Pulse 85   Temp 98 F (36.7 C)   Ht 5' 6 (1.676 m)   Wt 174 lb (78.9 kg)   SpO2 99%   BMI 28.08 kg/m   Wt Readings from Last 3 Encounters:  09/19/24 174 lb (78.9 kg)  03/18/24 179 lb (81.2 kg)  10/19/23 178 lb 12.8 oz (81.1 kg)    Physical Exam Vitals and nursing note reviewed.  Constitutional:      General: She is not in acute distress.    Appearance: Normal appearance. She is well-developed. She is not diaphoretic.  Eyes:     Conjunctiva/sclera: Conjunctivae normal.     Pupils: Pupils are equal, round, and reactive to light.  Neck:     Thyroid : No thyromegaly.  Cardiovascular:     Rate and Rhythm: Normal rate and regular rhythm.     Heart sounds: Normal heart sounds. No murmur heard. Pulmonary:     Effort: Pulmonary effort is normal. No respiratory distress.     Breath sounds: Normal breath sounds. No wheezing.  Chest:  Breasts:    Breasts are symmetrical.     Right: No inverted nipple, mass, nipple discharge, skin change or tenderness.     Left: No inverted nipple, mass, nipple discharge, skin change or tenderness.  Abdominal:     General: Bowel sounds are normal. There is no distension.     Palpations: Abdomen is soft.     Tenderness: There is no abdominal tenderness. There is no guarding or rebound.  Musculoskeletal:        General: Normal range of motion.     Cervical back: Neck supple.  Lymphadenopathy:     Cervical: No  cervical adenopathy.  Skin:    General: Skin is warm and dry.     Findings: No rash.  Neurological:     Mental Status: She is alert and oriented to person, place, and time.     Coordination: Coordination normal.  Psychiatric:        Behavior: Behavior normal.    Physical Exam   CHEST: Lungs clear to auscultation bilaterally. CARDIOVASCULAR: Heart regular rate and rhythm.  Results for orders placed or performed in visit on 03/18/24  Cologuard   Collection Time: 03/29/24 10:00 AM  Result Value Ref Range   COLOGUARD Negative Negative    Assessment & Plan:   Problem List Items Addressed This Visit       Other   Depression, recurrent   Relevant Orders   CBC with Differential/Platelet   CMP14+EGFR   Lipid panel   TSH   Anxiety   Relevant Orders   CBC with Differential/Platelet   CMP14+EGFR   Lipid panel   TSH   Other Visit Diagnoses       Well woman exam    -  Primary   Relevant Orders   CBC with Differential/Platelet   CMP14+EGFR   Lipid panel   TSH     Vasomotor symptoms due to menopause       Relevant Medications   estradiol  (ESTRACE ) 0.5 MG tablet          Menopausal symptoms following hysterectomy Significant menopausal symptoms post-hysterectomy affecting quality of life. Hormone replacement therapy considered viable. - Initiate hormone replacement therapy after determining appropriate dosing. - Encouraged self-breast exams and regular mammograms. - Plan follow-up in six months unless symptoms persist or worsen.  Major depressive disorder and anxiety disorder Conditions well-managed on Wellbutrin  and Lexapro . No changes desired. - Continue Wellbutrin  and Lexapro .  Adult Wellness Visit Routine visit with no acute concerns. Normal physical examination findings. - Perform blood work today. - Encouraged routine health maintenance, including yearly exams and breast exams.          Follow up plan: Return in about 6 months (around 03/20/2025),  or if symptoms worsen or fail to improve, for Anxiety depression recheck.  Counseling provided for all of the vaccine components Orders Placed This Encounter  Procedures   CBC with Differential/Platelet   CMP14+EGFR   Lipid panel   TSH    Fonda Levins, MD [facility] 09/19/2024, 3:56 PM

## 2024-09-20 LAB — CBC WITH DIFFERENTIAL/PLATELET
Basos: 1 %
EOS (ABSOLUTE): 0.1 x10E3/uL (ref 0.0–0.2)
Eos: 2 %
Hematocrit: 41.4 % (ref 34.0–46.6)
Hemoglobin: 12.8 g/dL (ref 11.1–15.9)
Immature Granulocytes: 0 %
Immature Granulocytes: 0 x10E3/uL (ref 0.0–0.1)
Lymphs: 29 %
MCH: 27.6 pg (ref 26.6–33.0)
MCHC: 30.9 g/dL — AB (ref 31.5–35.7)
MCV: 89 fL (ref 79–97)
Monocytes Absolute: 0.2 x10E3/uL (ref 0.0–0.4)
Monocytes Absolute: 0.6 x10E3/uL (ref 0.1–0.9)
Monocytes: 7 %
Neutrophils Absolute: 2.6 x10E3/uL (ref 0.7–3.1)
Neutrophils Absolute: 5.5 x10E3/uL (ref 1.4–7.0)
Neutrophils: 61 %
Platelets: 320 x10E3/uL (ref 150–450)
RBC: 4.64 x10E6/uL (ref 3.77–5.28)
RDW: 13 % (ref 11.7–15.4)
WBC: 9 x10E3/uL (ref 3.4–10.8)

## 2024-09-20 LAB — CMP14+EGFR
ALT: 13 IU/L (ref 0–32)
AST: 20 IU/L (ref 0–40)
Albumin: 4.3 g/dL (ref 3.9–4.9)
Alkaline Phosphatase: 75 IU/L (ref 41–116)
BUN/Creatinine Ratio: 14 (ref 9–23)
BUN: 14 mg/dL (ref 6–24)
Bilirubin Total: 0.3 mg/dL (ref 0.0–1.2)
CO2: 22 mmol/L (ref 20–29)
Calcium: 9.4 mg/dL (ref 8.7–10.2)
Chloride: 102 mmol/L (ref 96–106)
Creatinine, Ser: 0.97 mg/dL (ref 0.57–1.00)
Globulin, Total: 2.7 g/dL (ref 1.5–4.5)
Glucose: 87 mg/dL (ref 70–99)
Potassium: 3.9 mmol/L (ref 3.5–5.2)
Sodium: 138 mmol/L (ref 134–144)
Total Protein: 7 g/dL (ref 6.0–8.5)
eGFR: 73 mL/min/1.73 (ref >59)

## 2024-09-20 LAB — LIPID PANEL
Cholesterol, Total: 169 mg/dL (ref 100–199)
HDL: 58 mg/dL (ref >39)
LDL CALC COMMENT:: 2.9 ratio (ref 0.0–4.4)
LDL Chol Calc (NIH): 97 mg/dL (ref 0–99)
Triglycerides: 76 mg/dL (ref 0–149)
VLDL Cholesterol Cal: 14 mg/dL (ref 5–40)

## 2024-09-20 LAB — TSH: TSH: 2.43 u[IU]/mL (ref 0.450–4.500)

## 2024-09-28 ENCOUNTER — Ambulatory Visit: Payer: Self-pay | Admitting: Family Medicine

## 2024-10-05 ENCOUNTER — Other Ambulatory Visit (HOSPITAL_BASED_OUTPATIENT_CLINIC_OR_DEPARTMENT_OTHER): Payer: Self-pay

## 2024-10-05 MED ORDER — LISDEXAMFETAMINE DIMESYLATE 10 MG PO CAPS
10.0000 mg | ORAL_CAPSULE | Freq: Every evening | ORAL | 0 refills | Status: DC
  Filled 2024-10-05: qty 30, 30d supply, fill #0

## 2024-10-05 MED ORDER — LISDEXAMFETAMINE DIMESYLATE 50 MG PO CAPS
50.0000 mg | ORAL_CAPSULE | Freq: Every morning | ORAL | 0 refills | Status: DC
  Filled 2024-10-05: qty 30, 30d supply, fill #0

## 2024-10-11 ENCOUNTER — Encounter: Payer: Self-pay | Admitting: Family Medicine

## 2024-10-11 DIAGNOSIS — N951 Menopausal and female climacteric states: Secondary | ICD-10-CM

## 2024-10-12 MED ORDER — ESTRADIOL 1 MG PO TABS
1.0000 mg | ORAL_TABLET | Freq: Every day | ORAL | 1 refills | Status: AC
Start: 2024-10-12 — End: ?

## 2024-11-04 ENCOUNTER — Other Ambulatory Visit (HOSPITAL_BASED_OUTPATIENT_CLINIC_OR_DEPARTMENT_OTHER): Payer: Self-pay

## 2024-11-04 MED ORDER — LISDEXAMFETAMINE DIMESYLATE 10 MG PO CAPS
10.0000 mg | ORAL_CAPSULE | Freq: Every day | ORAL | 0 refills | Status: DC
  Filled 2024-11-04: qty 30, 30d supply, fill #0

## 2024-11-04 MED ORDER — LISDEXAMFETAMINE DIMESYLATE 50 MG PO CAPS
50.0000 mg | ORAL_CAPSULE | Freq: Every morning | ORAL | 0 refills | Status: DC
  Filled 2024-11-04: qty 30, 30d supply, fill #0

## 2024-12-19 ENCOUNTER — Other Ambulatory Visit (HOSPITAL_BASED_OUTPATIENT_CLINIC_OR_DEPARTMENT_OTHER): Payer: Self-pay

## 2024-12-19 MED ORDER — LISDEXAMFETAMINE DIMESYLATE 10 MG PO CAPS
10.0000 mg | ORAL_CAPSULE | Freq: Every day | ORAL | 0 refills | Status: AC
  Filled 2024-12-19: qty 30, 30d supply, fill #0

## 2024-12-19 MED ORDER — LISDEXAMFETAMINE DIMESYLATE 50 MG PO CAPS
50.0000 mg | ORAL_CAPSULE | Freq: Every morning | ORAL | 0 refills | Status: AC
  Filled 2024-12-19: qty 30, 30d supply, fill #0

## 2024-12-30 ENCOUNTER — Other Ambulatory Visit (HOSPITAL_BASED_OUTPATIENT_CLINIC_OR_DEPARTMENT_OTHER): Payer: Self-pay

## 2025-03-20 ENCOUNTER — Ambulatory Visit: Payer: Self-pay | Admitting: Family Medicine
